# Patient Record
Sex: Female | Born: 1945
Health system: Southern US, Community
[De-identification: ages and names within clinical notes are randomized; demographics above are authoritative.]

## PROBLEM LIST (undated history)

## (undated) DIAGNOSIS — E785 Hyperlipidemia, unspecified: Secondary | ICD-10-CM

## (undated) DIAGNOSIS — R42 Dizziness and giddiness: Secondary | ICD-10-CM

## (undated) DIAGNOSIS — M810 Age-related osteoporosis without current pathological fracture: Secondary | ICD-10-CM

## (undated) DIAGNOSIS — R351 Nocturia: Secondary | ICD-10-CM

## (undated) DIAGNOSIS — I10 Essential (primary) hypertension: Secondary | ICD-10-CM

## (undated) DIAGNOSIS — M4800 Spinal stenosis, site unspecified: Secondary | ICD-10-CM

## (undated) DIAGNOSIS — M1990 Unspecified osteoarthritis, unspecified site: Secondary | ICD-10-CM

## (undated) DIAGNOSIS — K219 Gastro-esophageal reflux disease without esophagitis: Secondary | ICD-10-CM

## (undated) DIAGNOSIS — R200 Anesthesia of skin: Secondary | ICD-10-CM

## (undated) DIAGNOSIS — G479 Sleep disorder, unspecified: Secondary | ICD-10-CM

## (undated) HISTORY — DX: Dizziness and giddiness: R42

## (undated) HISTORY — PX: ABDOMINAL HYSTERECTOMY: SHX81

---

## 2010-01-25 ENCOUNTER — Encounter: Admission: RE | Admit: 2010-01-25 | Discharge: 2010-01-25 | Payer: Self-pay | Admitting: Obstetrics and Gynecology

## 2010-03-27 ENCOUNTER — Ambulatory Visit: Payer: Self-pay | Admitting: Vascular Surgery

## 2010-12-17 NOTE — Procedures (Signed)
DUPLEX DEEP VENOUS EXAM - LOWER EXTREMITY   INDICATION:  Right lower extremity pain for 1 year.   HISTORY:  Edema:  No.  Trauma/Surgery:  No.  Pain:  Yes.  PE:  No.  Previous DVT:  No.  Anticoagulants:  No.  Other:   DUPLEX EXAM:                CFV   SFV   PopV  PTV    GSV                R  L  R  L  R  L  R   L  R  L  Thrombosis    o  o  o     o     o      o  o  Spontaneous   +  +  +     +            +  +  Phasic        +  +  +     +            +  +  Augmentation  +  +  +     +            +  +  Compressible  +  +  +     +     +      +  +  Competent     o  +  o     o            +  +   Legend:  + - yes  o - no  p - partial  D - decreased   IMPRESSION:  1. No evidence of right lower extremity deep or superficial      thrombosis.  2. Evidence of mild deep venous insufficiency.    _____________________________  Di Kindle. Edilia Bo, M.D.   EM/MEDQ  D:  03/27/2010  T:  03/27/2010  Job:  [id number]

## 2010-12-17 NOTE — Consult Note (Signed)
VASCULAR SURGERY CONSULTATION   Boccio, Edris  DOB:  1945-12-15                                       03/27/2010  CHART#:[id number]   I saw the patient in the office today in consultation concerning her  right calf pain.  This is a pleasant 65 year old woman who noted the  gradual onset of pain in her right calf approximately a year ago.  She  works where she has to stand all day and states that standing does  aggravate her symptoms.  She also states that her right calf hurts with  ambulation and this is relieved somewhat with rest but really the pain  never goes away completely.  She has had no history of rest pain and no  history of nonhealing ulcers.  She did have a knee surgery and had a  pulmonary embolus after this.  This was about 2 years ago.  She was on  Coumadin for a year.  She is unaware of any other history of DVT.  The  only thing that relieves her symptoms somewhat is elevating her legs.   Her past medical history is significant for hypertension and  hypercholesterolemia, both of which are stable on her current  medications.  She denies any history of diabetes, history of previous  myocardial infarction, history of congestive heart failure or history of  COPD.   SOCIAL HISTORY:  She is single.  She has one child.  She works as a  Geographical information systems officer.  She does not smoke cigarettes.   FAMILY HISTORY:  There is no history of premature cardiovascular  disease.   REVIEW OF SYSTEMS:  GENERAL:  She has had no recent weight loss, weight  gain or problems with her appetite.  CARDIOVASCULAR:  She has had no chest pain, chest pressure, palpitations  or arrhythmias.  She has had no history of stroke, TIAs or amaurosis  fugax.  MUSCULOSKELETAL:  She does have muscle pain.  She has had no significant  arthritis.  GI, neurologic, pulmonary, hematologic, GU, ENT, psychiatric and  integumentary review of systems is unremarkable and is documented on the  medical  history form in her chart.   PHYSICAL EXAMINATION:  General:  This is a pleasant 65 year old woman  who appears her stated age.  Vital signs:  Blood pressure is 130/80,  heart rate is 64, saturation 98%, temperature 98.1.  HEENT:  Unremarkable.  Lungs:  Are clear bilaterally to auscultation without  rales, rhonchi or wheezing.  Cardiovascular:  I do not detect any  carotid bruits.  She has a regular rate and rhythm.  She has palpable  femoral, popliteal and dorsalis pedis pulses bilaterally.  I cannot  palpate posterior tibial pulses.  Both feet are warm and well-perfused.  She has no significant lower extremity swelling.  Abdomen:  Soft and  nontender with normal pitched bowel sounds.  No aneurysm is appreciated.  Musculoskeletal:  There are no major deformities or cyanosis.  Neurological:  She has no focal weakness or paresthesias.  Skin:  There  are no ulcers or rashes.   I did independently perform a Doppler study today which shows biphasic  dorsalis pedis and posterior tibial signals bilaterally.   I have also ordered and independently interpreted a venous duplex scan  and there is no evidence of DVT in the right lower extremity.  There  is  some evidence of mild deep venous insufficiency in the right common  femoral vein.  The saphenous vein is noted to be competent on the right  and also on the left.   IMPRESSION:  This patient has a long history of calf pain which I  suspect is related to the fact that she has to stand all day and she  does have some mild chronic venous insufficiency.  There is no evidence  of arterial insufficiency.  We have discussed the importance of  elevating her legs when she gets home from work and trying to walk or  stand on her toes at times to use her calf muscle pump to lower her  venous pressure.  I have also written her a prescription for knee-high  compression stockings with a gradient of 15-20 mmHg which may help her  symptoms at work.  I will  be happy to see her back at any time if any  new vascular issues arise.     Di Kindle. Edilia Bo, M.D.  Electronically Signed  CSD/MEDQ  D:  03/27/2010  T:  03/28/2010  Job:  [id number]   cc:   Dr Tommas Olp

## 2011-08-05 HISTORY — PX: ROTATOR CUFF REPAIR: SHX139

## 2012-09-27 ENCOUNTER — Other Ambulatory Visit: Payer: Self-pay | Admitting: Internal Medicine

## 2012-09-27 DIAGNOSIS — Z1231 Encounter for screening mammogram for malignant neoplasm of breast: Secondary | ICD-10-CM

## 2012-10-28 ENCOUNTER — Ambulatory Visit
Admission: RE | Admit: 2012-10-28 | Discharge: 2012-10-28 | Disposition: A | Discharge status: Home / Self Care | Payer: Medicare Other | Source: Ambulatory Visit | Attending: Internal Medicine | Admitting: Internal Medicine

## 2012-10-28 DIAGNOSIS — Z1231 Encounter for screening mammogram for malignant neoplasm of breast: Secondary | ICD-10-CM

## 2012-10-29 ENCOUNTER — Other Ambulatory Visit: Payer: Self-pay | Admitting: Internal Medicine

## 2012-10-29 DIAGNOSIS — R928 Other abnormal and inconclusive findings on diagnostic imaging of breast: Secondary | ICD-10-CM

## 2012-11-09 ENCOUNTER — Other Ambulatory Visit: Payer: Medicare Other

## 2012-11-18 ENCOUNTER — Ambulatory Visit
Admission: RE | Admit: 2012-11-18 | Discharge: 2012-11-18 | Disposition: A | Discharge status: Home / Self Care | Payer: Medicare Other | Source: Ambulatory Visit | Attending: Internal Medicine | Admitting: Internal Medicine

## 2012-11-18 DIAGNOSIS — R928 Other abnormal and inconclusive findings on diagnostic imaging of breast: Secondary | ICD-10-CM

## 2012-12-08 ENCOUNTER — Other Ambulatory Visit: Payer: Self-pay | Admitting: Orthopedic Surgery

## 2012-12-08 DIAGNOSIS — N289 Disorder of kidney and ureter, unspecified: Secondary | ICD-10-CM

## 2012-12-14 ENCOUNTER — Ambulatory Visit
Admission: RE | Admit: 2012-12-14 | Discharge: 2012-12-14 | Disposition: A | Discharge status: Home / Self Care | Payer: Medicare Other | Source: Ambulatory Visit | Attending: Orthopedic Surgery | Admitting: Orthopedic Surgery

## 2012-12-14 DIAGNOSIS — N289 Disorder of kidney and ureter, unspecified: Secondary | ICD-10-CM

## 2013-01-17 ENCOUNTER — Other Ambulatory Visit: Payer: Self-pay | Admitting: Orthopedic Surgery

## 2013-01-17 DIAGNOSIS — M48061 Spinal stenosis, lumbar region without neurogenic claudication: Secondary | ICD-10-CM

## 2013-01-20 ENCOUNTER — Inpatient Hospital Stay
Admission: RE | Admit: 2013-01-20 | Discharge: 2013-01-20 | Disposition: A | Discharge status: Home / Self Care | Payer: Self-pay | Source: Ambulatory Visit | Attending: Orthopedic Surgery | Admitting: Orthopedic Surgery

## 2013-01-20 ENCOUNTER — Other Ambulatory Visit: Payer: Self-pay | Admitting: Orthopedic Surgery

## 2013-01-20 ENCOUNTER — Ambulatory Visit
Admission: RE | Admit: 2013-01-20 | Discharge: 2013-01-20 | Disposition: A | Discharge status: Home / Self Care | Payer: Medicare Other | Source: Ambulatory Visit | Attending: Orthopedic Surgery | Admitting: Orthopedic Surgery

## 2013-01-20 VITALS — BP 106/57 | HR 74 | Ht 62.0 in | Wt 148.0 lb

## 2013-01-20 DIAGNOSIS — M48061 Spinal stenosis, lumbar region without neurogenic claudication: Secondary | ICD-10-CM

## 2013-01-20 DIAGNOSIS — M549 Dorsalgia, unspecified: Secondary | ICD-10-CM

## 2013-01-20 MED ORDER — DIAZEPAM 5 MG PO TABS
5.0000 mg | ORAL_TABLET | Freq: Once | ORAL | Status: AC
  Administered 2013-01-20: 5 mg via ORAL

## 2013-01-20 MED ORDER — IOHEXOL 180 MG/ML  SOLN
15.0000 mL | Freq: Once | INTRAMUSCULAR | Status: AC | PRN
  Administered 2013-01-20: 15 mL via INTRATHECAL

## 2013-01-20 MED ORDER — ONDANSETRON HCL 4 MG/2ML IJ SOLN
4.0000 mg | Freq: Once | INTRAMUSCULAR | Status: AC
  Administered 2013-01-20: 4 mg via INTRAMUSCULAR

## 2013-01-20 MED ORDER — MEPERIDINE HCL 100 MG/ML IJ SOLN
75.0000 mg | Freq: Once | INTRAMUSCULAR | Status: AC
  Administered 2013-01-20: 75 mg via INTRAMUSCULAR

## 2013-01-20 NOTE — Progress Notes (Signed)
Discharge instructions explained to pt. 

## 2013-02-15 ENCOUNTER — Other Ambulatory Visit (HOSPITAL_COMMUNITY): Payer: Self-pay | Admitting: *Deleted

## 2013-02-16 ENCOUNTER — Encounter (HOSPITAL_COMMUNITY): Payer: Self-pay

## 2013-02-16 ENCOUNTER — Ambulatory Visit (HOSPITAL_COMMUNITY)
Admission: RE | Admit: 2013-02-16 | Discharge: 2013-02-16 | Disposition: A | Discharge status: Home / Self Care | Payer: Medicare Other | Source: Ambulatory Visit | Attending: Surgical | Admitting: Surgical

## 2013-02-16 ENCOUNTER — Encounter (HOSPITAL_COMMUNITY)
Admission: RE | Admit: 2013-02-16 | Discharge: 2013-02-16 | Disposition: A | Discharge status: Home / Self Care | Payer: Medicare Other | Source: Ambulatory Visit | Attending: Orthopedic Surgery | Admitting: Orthopedic Surgery

## 2013-02-16 ENCOUNTER — Encounter (HOSPITAL_COMMUNITY): Payer: Self-pay | Admitting: Pharmacy Technician

## 2013-02-16 DIAGNOSIS — I7090 Unspecified atherosclerosis: Secondary | ICD-10-CM | POA: Insufficient documentation

## 2013-02-16 DIAGNOSIS — Z01810 Encounter for preprocedural cardiovascular examination: Secondary | ICD-10-CM | POA: Insufficient documentation

## 2013-02-16 DIAGNOSIS — M4800 Spinal stenosis, site unspecified: Secondary | ICD-10-CM | POA: Insufficient documentation

## 2013-02-16 DIAGNOSIS — M4310 Spondylolisthesis, site unspecified: Secondary | ICD-10-CM | POA: Insufficient documentation

## 2013-02-16 DIAGNOSIS — Z01818 Encounter for other preprocedural examination: Secondary | ICD-10-CM | POA: Insufficient documentation

## 2013-02-16 HISTORY — DX: Gastro-esophageal reflux disease without esophagitis: K21.9

## 2013-02-16 HISTORY — DX: Unspecified osteoarthritis, unspecified site: M19.90

## 2013-02-16 HISTORY — DX: Age-related osteoporosis without current pathological fracture: M81.0

## 2013-02-16 HISTORY — DX: Anesthesia of skin: R20.0

## 2013-02-16 HISTORY — DX: Spinal stenosis, site unspecified: M48.00

## 2013-02-16 HISTORY — DX: Nocturia: R35.1

## 2013-02-16 HISTORY — DX: Sleep disorder, unspecified: G47.9

## 2013-02-16 HISTORY — DX: Hyperlipidemia, unspecified: E78.5

## 2013-02-16 HISTORY — DX: Essential (primary) hypertension: I10

## 2013-02-16 LAB — COMPREHENSIVE METABOLIC PANEL
ALT: 21 U/L (ref 0–35)
AST: 25 U/L (ref 0–37)
Albumin: 4.4 g/dL (ref 3.5–5.2)
Alkaline Phosphatase: 145 U/L — ABNORMAL HIGH (ref 39–117)
BUN: 12 mg/dL (ref 6–23)
CO2: 24 mEq/L (ref 19–32)
Calcium: 10 mg/dL (ref 8.4–10.5)
Chloride: 105 mEq/L (ref 96–112)
Creatinine, Ser: 0.67 mg/dL (ref 0.50–1.10)
GFR calc Af Amer: >90 mL/min (ref >90)
GFR calc non Af Amer: 88 mL/min — ABNORMAL LOW (ref >90)
Glucose, Bld: 120 mg/dL — ABNORMAL HIGH (ref 70–99)
Potassium: 4.3 mEq/L (ref 3.5–5.1)
Sodium: 140 mEq/L (ref 135–145)
Total Bilirubin: 0.4 mg/dL (ref 0.3–1.2)
Total Protein: 7.8 g/dL (ref 6.0–8.3)

## 2013-02-16 LAB — CBC
HCT: 41.7 % (ref 36.0–46.0)
MCHC: 33.3 g/dL (ref 30.0–36.0)
Platelets: 369 10*3/uL (ref 150–400)
RDW: 13.9 % (ref 11.5–15.5)
WBC: 7.9 10*3/uL (ref 4.0–10.5)

## 2013-02-16 LAB — URINALYSIS, ROUTINE W REFLEX MICROSCOPIC
Bilirubin Urine: NEGATIVE
Glucose, UA: NEGATIVE mg/dL
Hgb urine dipstick: NEGATIVE
Ketones, ur: NEGATIVE mg/dL
Nitrite: NEGATIVE
Protein, ur: NEGATIVE mg/dL
Specific Gravity, Urine: 1.03 (ref 1.005–1.030)
Urobilinogen, UA: 1 mg/dL (ref 0.0–1.0)
pH: 7 (ref 5.0–8.0)

## 2013-02-16 LAB — URINE MICROSCOPIC-ADD ON

## 2013-02-16 LAB — PROTIME-INR
INR: 0.95 (ref 0.00–1.49)
Prothrombin Time: 12.5 seconds (ref 11.6–15.2)

## 2013-02-16 LAB — APTT: aPTT: 29 seconds (ref 24–37)

## 2013-02-16 LAB — SURGICAL PCR SCREEN
MRSA, PCR: NEGATIVE
Staphylococcus aureus: NEGATIVE

## 2013-02-16 NOTE — Patient Instructions (Addendum)
Kristy Pope  02/16/2013                           YOUR PROCEDURE IS SCHEDULED ON:  02/22/13               PLEASE REPORT TO SHORT STAY CENTER AT :  10:30 AM               CALL THIS NUMBER IF ANY PROBLEMS THE DAY OF SURGERY :               [phone]                      REMEMBER:   Do not eat food or drink liquids AFTER MIDNIGHT  May have clear liquids UNTIL 6 HOURS BEFORE SURGERY (7:00 AM)  Clear liquids include soda, tea, black coffee, apple or grape juice, broth.  Take these medicines the morning of surgery with A SIP OF WATER: NONE (TAKE MEDS THE EVENING BEFORE LIKE YOU NORMALLY DO )   Do not wear jewelry, make-up   Do not wear lotions, powders, or perfumes.   Do not shave legs or underarms 12 hrs. before surgery (men may shave face)  Do not bring valuables to the hospital.  Contacts, dentures or bridgework may not be worn into surgery.  Leave suitcase in the car. After surgery it may be brought to your room.  For patients admitted to the hospital more than one night, checkout time is 11:00                          The day of discharge.   Patients discharged the day of surgery will not be allowed to drive home                             If going home same day of surgery, must have someone stay with you first                           24 hrs at home and arrange for some one to drive you home from hospital.    Special Instructions:   Please read over the following fact sheets that you were given:               1. MRSA  INFORMATION                      2. [place] PREPARING FOR SURGERY SHEET               3. INCENTIVE SPIROMETER                                                X_____________________________________________________________________        Failure to follow these instructions may result in cancellation of your surgery

## 2013-02-16 NOTE — Progress Notes (Signed)
Abnormal UA / BMET faxed to Dr. Gioffre 

## 2013-02-20 NOTE — H&P (Signed)
Kristy Pope is an 67 y.o. female.   Chief Complaint: back pain HPI: Kristy Pope presented with the chief complaint of low back pain with no known injury. She has been dealing with this for 2-3 years. She denies numbness and tingling in the legs but has pain radiating into the both legs, left greater than right. No change in bladder or bowel function. MRI showed the she has some spinal stenosis at 4-5 with recess stenosis. She also has facet arthrosis at L5-S1.CT myelogram revealed that she has a complete block at L4-5.   Past Medical History  Diagnosis Date  . Hypertension   . Hyperlipidemia   . Numbness     RT LEG  . Spinal stenosis   . Arthritis   . Osteoporosis   . GERD (gastroesophageal reflux disease)   . Nocturia   . Sleeping difficulty     TAKES AMBIEN PRN    Past Surgical History  Procedure Laterality Date  . Rotator cuff repair  2013    LEFT  . Abdominal hysterectomy      Social History:  reports that she has never smoked. She has never used smokeless tobacco. She reports that she does not drink alcohol or use illicit drugs.  Allergies: No Known Allergies  Current outpatient prescriptions: acetaminophen (TYLENOL) 500 MG tablet, Take 1,000 mg by mouth every 6 (six) hours as needed for pain., Disp: , Rfl: ;  alendronate (FOSAMAX) 70 MG tablet, Take 70 mg by mouth every 7 (seven) days. Take with a full glass of water on an empty stomach., Disp: , Rfl: ;  amLODipine (NORVASC) 10 MG tablet, Take 10 mg by mouth every evening., Disp: , Rfl: ;  aspirin 81 MG tablet, Take 81 mg by mouth daily., Disp: , Rfl:  atorvastatin (LIPITOR) 80 MG tablet, Take 80 mg by mouth every evening., Disp: , Rfl: ;  ezetimibe (ZETIA) 10 MG tablet, Take 10 mg by mouth every evening., Disp: , Rfl: ;  HYDROcodone-acetaminophen (NORCO/VICODIN) 5-325 MG per tablet, Take 1 tablet by mouth every 8 (eight) hours as needed for pain., Disp: , Rfl: ;  omeprazole (PRILOSEC) 20 MG capsule, Take 20 mg by mouth daily., Disp: ,  Rfl:  zolpidem (AMBIEN) 10 MG tablet, Take 10 mg by mouth at bedtime as needed for sleep., Disp: , Rfl:    Review of Systems  Constitutional: Negative.   HENT: Negative.   Eyes: Negative.   Respiratory: Negative.   Cardiovascular: Positive for chest pain. Negative for palpitations, orthopnea, claudication, leg swelling and PND.       Cheat tenderness- muscular  Gastrointestinal: Negative.   Genitourinary: Negative.   Musculoskeletal: Positive for back pain and joint pain. Negative for myalgias and falls.  Skin: Negative.   Neurological: Negative.   Endo/Heme/Allergies: Negative.   Psychiatric/Behavioral: Negative.    Vitals Weight: 146 lb Height: 62 in Body Surface Area: 1.7 m Body Mass Index: 26.7 kg/m Pulse: 93 (Regular) BP: 178/87 (Sitting, Left Arm, Standard)  Physical Exam  Constitutional: She is oriented to person, place, and time. She appears well-developed and well-nourished. No distress.  HENT:  Head: Normocephalic and atraumatic.  Right Ear: External ear normal.  Left Ear: External ear normal.  Nose: Nose normal.  Mouth/Throat: Oropharynx is clear and moist.  Eyes: Conjunctivae and EOM are normal.  Neck: Normal range of motion. Neck supple. No tracheal deviation present. No thyromegaly present.  Cardiovascular: Normal rate, regular rhythm, normal heart sounds and intact distal pulses.   No murmur heard. Respiratory:  Effort normal and breath sounds normal. No respiratory distress. She has no wheezes. She exhibits no tenderness.  GI: Soft. Bowel sounds are normal. She exhibits no distension and no mass. There is no tenderness.  Musculoskeletal:       Right hip: Normal.       Left hip: Normal.       Right knee: Normal.       Left knee: Normal.       Lumbar back: She exhibits decreased range of motion, tenderness, pain and spasm.       Right lower leg: She exhibits no tenderness and no swelling.       Left lower leg: She exhibits no tenderness and no  swelling.  Lymphadenopathy:    She has no cervical adenopathy.  Neurological: She is alert and oriented to person, place, and time. She has normal strength and normal reflexes. No sensory deficit.  Skin: No rash noted. She is not diaphoretic. No erythema.  Psychiatric: She has a normal mood and affect. Her behavior is normal.     Assessment/Plan Spinal Stenosis, Lumbar (724.02) She needs to have decompressive lumbar laminectomy for spinals stenosis at L4-5. The possible complications of spinal surgery number one could be infection, which is extremely rare. We do use antibiotics prior to the surgery and during surgery and after surgery. Number two is always a slight degree of probability that you could develop a blood clot in your leg after any type of surgery and we try our best to prevent that with aspirin post op when it is safe to begin. The third is a dural leak. That is the spinal fluid leak that could occur. At certain rare times the bone or the disc could literally stick to the dura which is the lining which contains the spinal fluid and we could develop a small tear in that lining which we then patch up. That is an extremely rare complication. The last and final complication is a recurrent disc rupture. That means that you could rupture another small piece of disc later on down the road and there is about a 2% chance of that.    Kristy Pope LAUREN 02/20/2013, 1:00 PM

## 2013-02-22 ENCOUNTER — Inpatient Hospital Stay (HOSPITAL_COMMUNITY): Payer: Medicare Other

## 2013-02-22 ENCOUNTER — Inpatient Hospital Stay (HOSPITAL_COMMUNITY)
Admission: RE | Admit: 2013-02-22 | Discharge: 2013-02-24 | DRG: 491 | Disposition: A | Discharge status: Home Health | Payer: Medicare Other | Source: Ambulatory Visit | Attending: Orthopedic Surgery | Admitting: Orthopedic Surgery

## 2013-02-22 ENCOUNTER — Inpatient Hospital Stay (HOSPITAL_COMMUNITY): Payer: Medicare Other | Admitting: Anesthesiology

## 2013-02-22 ENCOUNTER — Encounter (HOSPITAL_COMMUNITY)
Admission: RE | Disposition: A | Discharge status: Home Health | Payer: Self-pay | Source: Ambulatory Visit | Attending: Orthopedic Surgery

## 2013-02-22 ENCOUNTER — Encounter (HOSPITAL_COMMUNITY): Payer: Self-pay | Admitting: *Deleted

## 2013-02-22 ENCOUNTER — Encounter (HOSPITAL_COMMUNITY): Payer: Self-pay | Admitting: Anesthesiology

## 2013-02-22 DIAGNOSIS — M810 Age-related osteoporosis without current pathological fracture: Secondary | ICD-10-CM | POA: Diagnosis present

## 2013-02-22 DIAGNOSIS — I10 Essential (primary) hypertension: Secondary | ICD-10-CM | POA: Diagnosis present

## 2013-02-22 DIAGNOSIS — M48062 Spinal stenosis, lumbar region with neurogenic claudication: Principal | ICD-10-CM | POA: Diagnosis present

## 2013-02-22 DIAGNOSIS — R12 Heartburn: Secondary | ICD-10-CM | POA: Diagnosis not present

## 2013-02-22 DIAGNOSIS — M479 Spondylosis, unspecified: Secondary | ICD-10-CM | POA: Diagnosis present

## 2013-02-22 DIAGNOSIS — E785 Hyperlipidemia, unspecified: Secondary | ICD-10-CM | POA: Diagnosis present

## 2013-02-22 HISTORY — PX: LUMBAR LAMINECTOMY/DECOMPRESSION MICRODISCECTOMY: SHX5026

## 2013-02-22 SURGERY — LUMBAR LAMINECTOMY/DECOMPRESSION MICRODISCECTOMY 1 LEVEL
Anesthesia: General | Site: Back | Wound class: Clean

## 2013-02-22 MED ORDER — LACTATED RINGERS IV SOLN
INTRAVENOUS | Status: DC
  Administered 2013-02-22: 1000 mL via INTRAVENOUS
  Administered 2013-02-22: 14:00:00 via INTRAVENOUS

## 2013-02-22 MED ORDER — HYDROMORPHONE HCL PF 1 MG/ML IJ SOLN
INTRAMUSCULAR | Status: DC | PRN
  Administered 2013-02-22: 1 mg via INTRAVENOUS

## 2013-02-22 MED ORDER — EZETIMIBE 10 MG PO TABS
10.0000 mg | ORAL_TABLET | Freq: Every evening | ORAL | Status: DC
  Administered 2013-02-22 – 2013-02-23 (×2): 10 mg via ORAL
  Filled 2013-02-22 (×3): qty 1

## 2013-02-22 MED ORDER — AMLODIPINE BESYLATE 10 MG PO TABS
10.0000 mg | ORAL_TABLET | Freq: Every evening | ORAL | Status: DC
  Administered 2013-02-22 – 2013-02-23 (×2): 10 mg via ORAL
  Filled 2013-02-22 (×3): qty 1

## 2013-02-22 MED ORDER — BUPIVACAINE LIPOSOME 1.3 % IJ SUSP
INTRAMUSCULAR | Status: DC | PRN
  Administered 2013-02-22: 20 mL

## 2013-02-22 MED ORDER — HYDROCODONE-ACETAMINOPHEN 5-325 MG PO TABS
1.0000 | ORAL_TABLET | ORAL | Status: DC | PRN
  Administered 2013-02-22 (×2): 1 via ORAL
  Filled 2013-02-22 (×2): qty 1

## 2013-02-22 MED ORDER — CEFAZOLIN SODIUM 1-5 GM-% IV SOLN
1.0000 g | Freq: Three times a day (TID) | INTRAVENOUS | Status: AC
  Administered 2013-02-22 – 2013-02-23 (×3): 1 g via INTRAVENOUS
  Filled 2013-02-22 (×3): qty 50

## 2013-02-22 MED ORDER — BISACODYL 10 MG RE SUPP: 10.0000 mg | Freq: Every day | RECTAL | Status: DC | PRN

## 2013-02-22 MED ORDER — MEPERIDINE HCL 50 MG/ML IJ SOLN: 6.2500 mg | INTRAMUSCULAR | Status: DC | PRN

## 2013-02-22 MED ORDER — ALENDRONATE SODIUM 70 MG PO TABS
70.0000 mg | ORAL_TABLET | ORAL | Status: DC
  Filled 2013-02-22: qty 1

## 2013-02-22 MED ORDER — ROCURONIUM BROMIDE 100 MG/10ML IV SOLN
INTRAVENOUS | Status: DC | PRN
  Administered 2013-02-22: 10 mg via INTRAVENOUS
  Administered 2013-02-22: 5 mg via INTRAVENOUS
  Administered 2013-02-22: 40 mg via INTRAVENOUS
  Administered 2013-02-22: 10 mg via INTRAVENOUS

## 2013-02-22 MED ORDER — LIDOCAINE HCL (CARDIAC) 20 MG/ML IV SOLN
INTRAVENOUS | Status: DC | PRN
  Administered 2013-02-22: 50 mg via INTRAVENOUS

## 2013-02-22 MED ORDER — ACETAMINOPHEN 325 MG PO TABS: 650.0000 mg | ORAL_TABLET | ORAL | Status: DC | PRN

## 2013-02-22 MED ORDER — PROPOFOL 10 MG/ML IV BOLUS
INTRAVENOUS | Status: DC | PRN
  Administered 2013-02-22: 140 mg via INTRAVENOUS

## 2013-02-22 MED ORDER — PROMETHAZINE HCL 25 MG/ML IJ SOLN: 6.2500 mg | INTRAMUSCULAR | Status: DC | PRN

## 2013-02-22 MED ORDER — THROMBIN 5000 UNITS EX SOLR
CUTANEOUS | Status: DC | PRN
  Administered 2013-02-22: 10000 [IU] via TOPICAL

## 2013-02-22 MED ORDER — METHOCARBAMOL 500 MG PO TABS
500.0000 mg | ORAL_TABLET | Freq: Four times a day (QID) | ORAL | Status: DC | PRN
  Administered 2013-02-22 – 2013-02-24 (×5): 500 mg via ORAL
  Filled 2013-02-22 (×5): qty 1

## 2013-02-22 MED ORDER — SODIUM CHLORIDE 0.9 % IR SOLN
Status: DC | PRN
  Administered 2013-02-22: 13:00:00

## 2013-02-22 MED ORDER — OXYCODONE-ACETAMINOPHEN 5-325 MG PO TABS
1.0000 | ORAL_TABLET | ORAL | Status: DC | PRN
  Administered 2013-02-22 – 2013-02-24 (×9): 2 via ORAL
  Filled 2013-02-22 (×9): qty 2

## 2013-02-22 MED ORDER — HYDROMORPHONE HCL PF 1 MG/ML IJ SOLN
0.5000 mg | INTRAMUSCULAR | Status: DC | PRN
  Administered 2013-02-22: 0.5 mg via INTRAVENOUS
  Filled 2013-02-22: qty 1

## 2013-02-22 MED ORDER — LACTATED RINGERS IV SOLN
INTRAVENOUS | Status: DC
  Administered 2013-02-22 – 2013-02-23 (×2): via INTRAVENOUS

## 2013-02-22 MED ORDER — ATORVASTATIN CALCIUM 80 MG PO TABS
80.0000 mg | ORAL_TABLET | Freq: Every evening | ORAL | Status: DC
  Administered 2013-02-22 – 2013-02-23 (×2): 80 mg via ORAL
  Filled 2013-02-22 (×3): qty 1

## 2013-02-22 MED ORDER — POLYETHYLENE GLYCOL 3350 17 G PO PACK: 17.0000 g | PACK | Freq: Every day | ORAL | Status: DC | PRN

## 2013-02-22 MED ORDER — METHOCARBAMOL 100 MG/ML IJ SOLN
500.0000 mg | Freq: Four times a day (QID) | INTRAVENOUS | Status: DC | PRN
  Administered 2013-02-22: 500 mg via INTRAVENOUS
  Filled 2013-02-22 (×2): qty 5

## 2013-02-22 MED ORDER — FENTANYL CITRATE 0.05 MG/ML IJ SOLN
INTRAMUSCULAR | Status: DC | PRN
  Administered 2013-02-22 (×2): 100 ug via INTRAVENOUS
  Administered 2013-02-22: 50 ug via INTRAVENOUS

## 2013-02-22 MED ORDER — NEOSTIGMINE METHYLSULFATE 1 MG/ML IJ SOLN
INTRAMUSCULAR | Status: DC | PRN
  Administered 2013-02-22: 4 mg via INTRAVENOUS

## 2013-02-22 MED ORDER — ACETAMINOPHEN 10 MG/ML IV SOLN
1000.0000 mg | Freq: Once | INTRAVENOUS | Status: AC | PRN
  Administered 2013-02-22: 1000 mg via INTRAVENOUS
  Filled 2013-02-22: qty 100

## 2013-02-22 MED ORDER — ONDANSETRON HCL 4 MG/2ML IJ SOLN
INTRAMUSCULAR | Status: DC | PRN
  Administered 2013-02-22: 4 mg via INTRAVENOUS

## 2013-02-22 MED ORDER — PHENOL 1.4 % MT LIQD
1.0000 | OROMUCOSAL | Status: DC | PRN
  Administered 2013-02-22: 1 via OROMUCOSAL
  Filled 2013-02-22: qty 177

## 2013-02-22 MED ORDER — PHENYLEPHRINE HCL 10 MG/ML IJ SOLN
INTRAMUSCULAR | Status: DC | PRN
  Administered 2013-02-22 (×2): 40 ug via INTRAVENOUS

## 2013-02-22 MED ORDER — BUPIVACAINE LIPOSOME 1.3 % IJ SUSP
20.0000 mL | Freq: Once | INTRAMUSCULAR | Status: DC
  Filled 2013-02-22: qty 20

## 2013-02-22 MED ORDER — ONDANSETRON HCL 4 MG/2ML IJ SOLN: 4.0000 mg | INTRAMUSCULAR | Status: DC | PRN

## 2013-02-22 MED ORDER — ZOLPIDEM TARTRATE 10 MG PO TABS: 10.0000 mg | ORAL_TABLET | Freq: Every evening | ORAL | Status: DC | PRN

## 2013-02-22 MED ORDER — FLEET ENEMA 7-19 GM/118ML RE ENEM: 1.0000 | ENEMA | Freq: Once | RECTAL | Status: AC | PRN

## 2013-02-22 MED ORDER — OXYCODONE HCL 5 MG/5ML PO SOLN
5.0000 mg | Freq: Once | ORAL | Status: DC | PRN
  Filled 2013-02-22: qty 5

## 2013-02-22 MED ORDER — BACITRACIN-NEOMYCIN-POLYMYXIN 400-5-5000 EX OINT
TOPICAL_OINTMENT | CUTANEOUS | Status: DC | PRN
  Administered 2013-02-22: 1 via TOPICAL

## 2013-02-22 MED ORDER — HYDROMORPHONE HCL PF 1 MG/ML IJ SOLN
0.2500 mg | INTRAMUSCULAR | Status: DC | PRN
  Administered 2013-02-22 (×3): 0.5 mg via INTRAVENOUS

## 2013-02-22 MED ORDER — ACETAMINOPHEN 650 MG RE SUPP: 650.0000 mg | RECTAL | Status: DC | PRN

## 2013-02-22 MED ORDER — PANTOPRAZOLE SODIUM 40 MG PO TBEC
40.0000 mg | DELAYED_RELEASE_TABLET | Freq: Every day | ORAL | Status: DC
  Administered 2013-02-22 – 2013-02-24 (×3): 40 mg via ORAL
  Filled 2013-02-22 (×3): qty 1

## 2013-02-22 MED ORDER — OXYCODONE HCL 5 MG PO TABS: 5.0000 mg | ORAL_TABLET | Freq: Once | ORAL | Status: DC | PRN

## 2013-02-22 MED ORDER — GLYCOPYRROLATE 0.2 MG/ML IJ SOLN
INTRAMUSCULAR | Status: DC | PRN
  Administered 2013-02-22: .8 mg via INTRAVENOUS

## 2013-02-22 MED ORDER — MENTHOL 3 MG MT LOZG: 1.0000 | LOZENGE | OROMUCOSAL | Status: DC | PRN

## 2013-02-22 MED ORDER — CEFAZOLIN SODIUM-DEXTROSE 2-3 GM-% IV SOLR
2.0000 g | INTRAVENOUS | Status: AC
  Administered 2013-02-22: 2 g via INTRAVENOUS

## 2013-02-22 SURGICAL SUPPLY — 42 items
BAG ZIPLOCK 12X15 (MISCELLANEOUS) ×2 IMPLANT
BENZOIN TINCTURE PRP APPL 2/3 (GAUZE/BANDAGES/DRESSINGS) ×2 IMPLANT
CLEANER TIP ELECTROSURG 2X2 (MISCELLANEOUS) ×2 IMPLANT
CLOTH BEACON ORANGE TIMEOUT ST (SAFETY) ×2 IMPLANT
CONT SPECI 4OZ STER CLIK (MISCELLANEOUS) ×2 IMPLANT
DRAIN PENROSE 18X1/4 LTX STRL (WOUND CARE) IMPLANT
DRAPE MICROSCOPE LEICA (MISCELLANEOUS) ×2 IMPLANT
DRAPE POUCH INSTRU U-SHP 10X18 (DRAPES) ×2 IMPLANT
DRAPE SURG 17X11 SM STRL (DRAPES) ×2 IMPLANT
DRSG ADAPTIC 3X8 NADH LF (GAUZE/BANDAGES/DRESSINGS) ×2 IMPLANT
DRSG PAD ABDOMINAL 8X10 ST (GAUZE/BANDAGES/DRESSINGS) ×2 IMPLANT
DURAPREP 26ML APPLICATOR (WOUND CARE) ×2 IMPLANT
ELECT REM PT RETURN 9FT ADLT (ELECTROSURGICAL) ×2
ELECTRODE REM PT RTRN 9FT ADLT (ELECTROSURGICAL) ×1 IMPLANT
GLOVE BIOGEL PI IND STRL 8 (GLOVE) ×2 IMPLANT
GLOVE BIOGEL PI INDICATOR 8 (GLOVE) ×2
GLOVE ECLIPSE 8.0 STRL XLNG CF (GLOVE) ×4 IMPLANT
GOWN PREVENTION PLUS LG XLONG (DISPOSABLE) ×4 IMPLANT
GOWN STRL REIN XL XLG (GOWN DISPOSABLE) ×4 IMPLANT
KIT BASIN OR (CUSTOM PROCEDURE TRAY) ×2 IMPLANT
KIT POSITIONING SURG ANDREWS (MISCELLANEOUS) ×2 IMPLANT
MANIFOLD NEPTUNE II (INSTRUMENTS) ×2 IMPLANT
NEEDLE SPNL 18GX3.5 QUINCKE PK (NEEDLE) ×4 IMPLANT
NS IRRIG 1000ML POUR BTL (IV SOLUTION) ×2 IMPLANT
PATTIES SURGICAL .5 X.5 (GAUZE/BANDAGES/DRESSINGS) ×2 IMPLANT
PATTIES SURGICAL .75X.75 (GAUZE/BANDAGES/DRESSINGS) IMPLANT
PATTIES SURGICAL 1X1 (DISPOSABLE) IMPLANT
PIN SAFETY NICK PLATE  2 MED (MISCELLANEOUS)
PIN SAFETY NICK PLATE 2 MED (MISCELLANEOUS) IMPLANT
POSITIONER SURGICAL ARM (MISCELLANEOUS) ×2 IMPLANT
SPONGE GAUZE 4X4 12PLY (GAUZE/BANDAGES/DRESSINGS) ×2 IMPLANT
SPONGE LAP 4X18 X RAY DECT (DISPOSABLE) ×4 IMPLANT
SPONGE SURGIFOAM ABS GEL 100 (HEMOSTASIS) ×2 IMPLANT
STAPLER VISISTAT 35W (STAPLE) IMPLANT
SUT VIC AB 0 CT1 27 (SUTURE) ×1
SUT VIC AB 0 CT1 27XBRD ANTBC (SUTURE) ×1 IMPLANT
SUT VIC AB 1 CT1 27 (SUTURE) ×4
SUT VIC AB 1 CT1 27XBRD ANTBC (SUTURE) ×4 IMPLANT
TAPE CLOTH SURG 4X10 WHT LF (GAUZE/BANDAGES/DRESSINGS) ×2 IMPLANT
TOWEL OR 17X26 10 PK STRL BLUE (TOWEL DISPOSABLE) ×4 IMPLANT
TRAY LAMINECTOMY (CUSTOM PROCEDURE TRAY) ×2 IMPLANT
WATER STERILE IRR 1500ML POUR (IV SOLUTION) IMPLANT

## 2013-02-22 NOTE — Interval H&P Note (Signed)
History and Physical Interval Note:  02/22/2013 1:08 PM  Kristy Pope  has presented today for surgery, with the diagnosis of Spinal Stenosis  The various methods of treatment have been discussed with the patient and family. After consideration of risks, benefits and other options for treatment, the patient has consented to  Procedure(s): LUMBAR LAMINECTOMY/COMPLETE DECOMPRESSION L4 - L5 1 LEVEL (N/A) as a surgical intervention .  The patient's history has been reviewed, patient examined, no change in status, stable for surgery.  I have reviewed the patient's chart and labs.  Questions were answered to the patient's satisfaction.     Tosh Glaze A

## 2013-02-22 NOTE — Anesthesia Postprocedure Evaluation (Signed)
  Anesthesia Post-op Note  Patient: Kristy Pope  Procedure(s) Performed: Procedure(s) (LRB): LUMBAR LAMINECTOMY/COMPLETE DECOMPRESSION L4 - L5 1 LEVEL (N/A)  Patient Location: PACU  Anesthesia Type: General  Level of Consciousness: awake and alert   Airway and Oxygen Therapy: Patient Spontanous Breathing  Post-op Pain: mild  Post-op Assessment: Post-op Vital signs reviewed, Patient's Cardiovascular Status Stable, Respiratory Function Stable, Patent Airway and No signs of Nausea or vomiting  Last Vitals:  Filed Vitals:   02/22/13 1543  BP:   Pulse: 82  Temp: 36.8 C  Resp: 12    Post-op Vital Signs: stable   Complications: No apparent anesthesia complications

## 2013-02-22 NOTE — H&P (View-Only) (Signed)
Abnormal UA / BMET faxed to Dr. Darrelyn Hillock

## 2013-02-22 NOTE — Brief Op Note (Signed)
02/22/2013  3:38 PM  PATIENT:  Kristy Pope  67 y.o. female  PRE-OPERATIVE DIAGNOSIS:  Spinal Stenosis,Severe   POST-OPERATIVE DIAGNOSIS:  SPINAL STENOSIS,Severe at L-3-L-4,L-4-L-5  PROCEDURE:  Procedure(s): LUMBAR LAMINECTOMY/COMPLETE DECOMPRESSION L4 - L5 1 LEVEL (N/A) and L-3-l-4.\,with Bilateral Foraminotomies at TWO  Levels for L-4 and L-5 Roots.  SURGEON:  Surgeon(s) and Role:    * Jacki Cones, MD - Primary    * Drucilla Schmidt, MD - Assisting  :   ASSISTANTS:James Aplington MD   ANESTHESIA:   general  EBL:  Total I/O In: 1000 [I.V.:1000] Out: 250 [Urine:250]  BLOOD ADMINISTERED:none  DRAINS: none   LOCAL MEDICATIONS USED:  BUPIVICAINE 20cc.  SPECIMEN:  No Specimen  DISPOSITION OF SPECIMEN:  N/A  COUNTS:  YES  TOURNIQUET:  * No tourniquets in log *  DICTATION: .Other Dictation: Dictation Number [id number]  PLAN OF CARE: Admit for overnight observation  PATIENT DISPOSITION:  PACU - hemodynamically stable.   Delay start of Pharmacological VTE agent (>24hrs) due to surgical blood loss or risk of bleeding: yes

## 2013-02-22 NOTE — Transfer of Care (Signed)
Immediate Anesthesia Transfer of Care Note  Patient: Kristy Pope  Procedure(s) Performed: Procedure(s) (LRB): LUMBAR LAMINECTOMY/COMPLETE DECOMPRESSION L4 - L5 1 LEVEL (N/A)  Patient Location: PACU  Anesthesia Type: General  Level of Consciousness: sedated, patient cooperative and responds to stimulaton  Airway & Oxygen Therapy: Patient Spontanous Breathing and Patient connected to face mask oxgen  Post-op Assessment: Report given to PACU RN and Post -op Vital signs reviewed and stable  Post vital signs: Reviewed and stable  Complications: No apparent anesthesia complications

## 2013-02-22 NOTE — Anesthesia Preprocedure Evaluation (Addendum)
Anesthesia Evaluation  Patient identified by MRN, date of birth, ID band Patient awake    Reviewed: Allergy & Precautions, H&P , NPO status , Patient's Chart, lab work & pertinent test results  Airway Mallampati: II TM Distance: >3 FB Neck ROM: Full    Dental  (+) Dental Advisory Given, Missing and Poor Dentition   Pulmonary neg pulmonary ROS,  breath sounds clear to auscultation        Cardiovascular hypertension, Pt. on medications Rhythm:Regular Rate:Normal     Neuro/Psych negative neurological ROS  negative psych ROS   GI/Hepatic Neg liver ROS, GERD-  Medicated,  Endo/Other  negative endocrine ROS  Renal/GU negative Renal ROS     Musculoskeletal negative musculoskeletal ROS (+)   Abdominal   Peds  Hematology negative hematology ROS (+)   Anesthesia Other Findings   Reproductive/Obstetrics negative OB ROS                          Anesthesia Physical Anesthesia Plan  ASA: II  Anesthesia Plan: General   Post-op Pain Management:    Induction: Intravenous  Airway Management Planned: Oral ETT  Additional Equipment:   Intra-op Plan:   Post-operative Plan: Extubation in OR  Informed Consent: I have reviewed the patients History and Physical, chart, labs and discussed the procedure including the risks, benefits and alternatives for the proposed anesthesia with the patient or authorized representative who has indicated his/her understanding and acceptance.   Dental advisory given  Plan Discussed with: CRNA  Anesthesia Plan Comments:         Anesthesia Quick Evaluation

## 2013-02-22 NOTE — Progress Notes (Signed)
PHARMACIST - PHYSICIAN COMMUNICATION  CONCERNING: P&T Medication Policy Regarding Oral Bisphosphonates  RECOMMENDATION: Your order for alendronate (Fosamax), ibandronate (Boniva), or risedronate (Actonel) has been discontinued at this time.  If the patient's post-hospital medical condition warrants safe use of this class of drugs, please resume the pre-hospital regimen upon discharge.  DESCRIPTION:  Alendronate (Fosamax), ibandronate (Boniva), and risedronate (Actonel) can cause severe esophageal erosions in patients who are unable to remain upright at least 30 minutes after taking this medication.   Since brief interruptions in therapy are thought to have minimal impact on bone mineral density, the Pharmacy & Therapeutics Committee has established that bisphosphonate orders should be routinely discontinued during hospitalization.   To override this safety policy and permit administration of Boniva, Fosamax, or Actonel in the hospital, prescribers must write "DO NOT HOLD" in the comments section when placing the order for this class of medications.   Clance Boll, PharmD, BCPS Pager: [phone] 02/22/2013 5:35 PM

## 2013-02-23 ENCOUNTER — Encounter (HOSPITAL_COMMUNITY): Payer: Self-pay | Admitting: Orthopedic Surgery

## 2013-02-23 MED ORDER — CALCIUM CARBONATE ANTACID 500 MG PO CHEW
2.0000 | CHEWABLE_TABLET | ORAL | Status: DC | PRN
  Administered 2013-02-23: 400 mg via ORAL
  Filled 2013-02-23 (×2): qty 2

## 2013-02-23 MED ORDER — ALUM & MAG HYDROXIDE-SIMETH 200-200-20 MG/5ML PO SUSP
30.0000 mL | Freq: Four times a day (QID) | ORAL | Status: DC | PRN
  Administered 2013-02-23 (×2): 30 mL via ORAL
  Filled 2013-02-23 (×2): qty 30

## 2013-02-23 NOTE — Op Note (Signed)
NAMENEVAYA, NAGELE                   ACCOUNT NO.:  [id number]  MEDICAL RECORD NO.:  [id number]  LOCATION:  1616                         FACILITY:  [facility]  PHYSICIAN:  Georges Lynch. Jonesha Tsuchiya, M.D.DATE OF BIRTH:  08-07-1945  DATE OF PROCEDURE:  02/22/2013 DATE OF DISCHARGE:                              OPERATIVE REPORT   PREOPERATIVE DIAGNOSIS:  Severe spinal stenosis with a complete block involving L3-4 and L4-5, 2 levels.  POSTOPERATIVE DIAGNOSIS:  Severe spinal stenosis with a complete block involving L3-4 and L4-5, 2 levels.  PROCEDURE: 1. Complete decompressive lumbar laminectomy at L3-4 and L4-5. 2. Foraminotomies for 2 nerve roots bilaterally at L3-4 and L4-5 to     free up the L4 and the L5 root.  SURGEON:  Georges Lynch. Darrelyn Hillock, M.D.  ASSISTANT:  Marlowe Kays, M.D.  DESCRIPTION OF PROCEDURE:  Under general anesthesia, routine orthopedic prep and draping the lower back was carried out.  The appropriate time- out was carried out.  I also did not need to mark the side of the back, as she has had bilateral leg pain, we are going  centrally.  At this time, after the time-out 2 needles were placed in the back for localization purposes.  X-ray was taken to verify the position. Incision then was made over the L3-4, L4-5 space bleeders identified and cauterized.  I then separated the muscle bilaterally from the lamina and spinous processes.  Another x-ray was taken with 2 instruments in place. Following that, I went down and started my central decompressive lumbar laminectomy by removing the spinous process of L4.  I removed a part of L3 and a part of L5.  I then went down and did a complete lumbar laminectomies and then exposed the dura.  Great care was taken not to injure the dura.  I brought the microscope in.  Following that, we noted a complete thickened ligamentum flavum at L4-5so we first proceeded proximally to go near the L3-4 level, and then distal to the L4-5 level keeping  the ligamentum flavum in place because of the scar tissue present and the spondylolisthesis.  After this, we thoroughly protected the dura at all time, went out far laterally, out in the lateral recesses and then into the foramina and freed up the root.  I then protected the dura with the cottonoids and then elevated the scar ligamentum flavum.  We had a nice dissection removing that and then removed the ligamentum flavum in the usual fashion.  We now had complete freedom distally, proximally and out into both foramina bilaterally with a hockey-stick.  There was no real true herniated disk.  In this case, she had a hard disk at L4-5.  No diskectomy was necessary.  I thoroughly irrigated out the area.  Reexplored it with a hockey-stick and now the roots were totally free.  The dura was free distally and the dura was freed proximally.  We did not need to do any further decompression.  I then loosely applied some thrombin-soaked Gelfoam, closed the wound in layers usual fashion.  I left a small deep distal and proximal part of the wound open for drainage purposes.  I then  injected 20 mL of Exparel into the soft tissue.  I closed the subcutaneous with #1 Vicryl and the skin with metal staples and a sterile Neosporin dressing was applied. She had 2 g of IV Ancef preop.          ______________________________ Georges Lynch. Darrelyn Hillock, M.D.     RAG/MEDQ  D:  02/22/2013  T:  02/22/2013  Job:  [id number]

## 2013-02-23 NOTE — Progress Notes (Signed)
   Subjective: 1 Day Post-Op Procedure(s) (LRB): LUMBAR LAMINECTOMY/COMPLETE DECOMPRESSION L4 - L5 1 LEVEL (N/A) Patient reports pain as moderate.   Patient seen in rounds without Dr. Darrelyn Hillock. Patient is well, but has had some minor complaints of reflux symptoms. She says that she has been having some trouble with heartburn since surgery. She reports that it is somewhat better this morning. She denies shortness of breath and chest pain. She has some discomfort in her low back but no leg pain.  Plan is to go Home after hospital stay.  Objective: Vital signs in last 24 hours: Temp:  [97.5 F (36.4 C)-99.1 F (37.3 C)] 98.9 F (37.2 C) (07/23 0645) Pulse Rate:  [66-96] 72 (07/23 0645) Resp:  [12-18] 16 (07/23 0645) BP: (123-166)/(72-83) 123/74 mmHg (07/23 0645) SpO2:  [97 %-100 %] 98 % (07/23 0645) Weight:  [70.308 kg (155 lb)] 70.308 kg (155 lb) (07/22 1700)  Intake/Output from previous day:  Intake/Output Summary (Last 24 hours) at 02/23/13 0756 Last data filed at 02/23/13 0600  Gross per 24 hour  Intake   3105 ml  Output   2850 ml  Net    255 ml     EXAM General - Patient is Alert and Oriented Extremity - Neurologically intact Neurovascular intact Dorsiflexion/Plantar flexion intact No cellulitis present Dressing/Incision - clean, dry, no drainage Motor Function - intact, moving foot and toes well on exam.   Past Medical History  Diagnosis Date  . Hypertension   . Hyperlipidemia   . Numbness     RT LEG  . Spinal stenosis   . Arthritis   . Osteoporosis   . GERD (gastroesophageal reflux disease)   . Nocturia   . Sleeping difficulty     TAKES AMBIEN PRN    Assessment/Plan: 1 Day Post-Op Procedure(s) (LRB): LUMBAR LAMINECTOMY/COMPLETE DECOMPRESSION L4 - L5 1 LEVEL (N/A) Active Problems:   Spinal stenosis, lumbar region, with neurogenic claudication  Estimated body mass index is 28.34 kg/(m^2) as calculated from the following:   Height as of this encounter: 5'  2" (1.575 m).   Weight as of this encounter: 70.308 kg (155 lb). Advance diet Up with therapy Plan for discharge tomorrow DC foley when up Weight-Bearing as tolerated   She is doing well this morning. We will get her up with therapy to walk. Will continue to monitor today and plan for discharge hoe tomorrow.   Gretta Samons LAUREN 02/23/2013, 7:56 AM

## 2013-02-23 NOTE — Evaluation (Signed)
Occupational Therapy Evaluation Patient Details Name: Kristy Pope MRN: [id number] DOB: 09-14-1945 Today's Date: 02/23/2013 Time: 4098-1191 OT Time Calculation (min): 26 min  OT Assessment / Plan / Recommendation History of present illness pt admitted with L3-4, 4-5 decompression   Clinical Impression   Pt was admitted for 2 level decompression.  She was independent with adls prior to admission and will have son's help 24/7 for 2 weeks.  He lives with her but works.  At time of eval, pt was experiencing slow processing of information and will need reinforcement with education.  She will benefit from continued OT.      OT Assessment  Patient needs continued OT Services    Follow Up Recommendations  Home health OT [facility] aide)    Barriers to Discharge      Equipment Recommendations  3 in 1 bedside comode    Recommendations for Other Services    Frequency  Min 2X/week    Precautions / Restrictions Precautions Precautions: Back Restrictions Weight Bearing Restrictions: No   Pertinent Vitals/Pain Back 7/8 of 10.  Repositioned in bed    ADL  Grooming: Set up Where Assessed - Grooming: Supported sitting Upper Body Bathing: Supervision/safety Where Assessed - Upper Body Bathing: Unsupported sitting Lower Body Bathing: Maximal assistance Where Assessed - Lower Body Bathing: Supported sit to stand Upper Body Dressing: Minimal assistance Where Assessed - Upper Body Dressing: Supported sitting Lower Body Dressing: Maximal assistance (with AE) Where Assessed - Lower Body Dressing: Supported sit to Pharmacist, hospital: Minimal assistance (extra time) Statistician Method: Surveyor, minerals:  (simulated:  chair to bed) Toileting - Clothing Manipulation and Hygiene: Moderate assistance Where Assessed - Toileting Clothing Manipulation and Hygiene: Standing Equipment Used: Reacher;Rolling walker;Sock aid Transfers/Ambulation Related to ADLs: Pt did well with mobility  but extra time required ADL Comments: Pt processing slowly, including answering PLOF questions.  Introduced AE but pt will need reinforcment.  After educating on back precautions, able to recall 1/3    OT Diagnosis: Generalized weakness  OT Problem List: Decreased strength;Decreased activity tolerance;Decreased cognition;Decreased knowledge of use of DME or AE;Decreased knowledge of precautions;Pain OT Treatment Interventions: Self-care/ADL training;DME and/or AE instruction;Patient/family education   OT Goals(Current goals can be found in the care plan section) Acute Rehab OT Goals Patient Stated Goal: none stated OT Goal Formulation: With patient Time For Goal Achievement: 03/02/13 Potential to Achieve Goals: Good ADL Goals Pt Will Perform Grooming: with supervision;standing Pt Will Perform Lower Body Bathing: with supervision;with adaptive equipment;sit to/from stand Pt Will Perform Lower Body Dressing: with min assist;with adaptive equipment;sit to/from stand Pt Will Transfer to Toilet: with supervision;ambulating;bedside commode Pt Will Perform Toileting - Clothing Manipulation and hygiene: with supervision;with adaptive equipment;sit to/from stand Additional ADL Goal #1: Pt will recall 3/3 back precautions  Visit Information  Last OT Received On: 02/23/13 Assistance Needed: +1 History of Present Illness: pt admitted with L3-4, 4-5 decompression       Prior Functioning     Home Living Family/patient expects to be discharged to:: Private residence Living Arrangements: Children Additional Comments: son will be home for a couple of weeks Prior Function Level of Independence: Independent Communication Communication: No difficulties (slow processing)         Vision/Perception     Cognition  Cognition Arousal/Alertness: Awake/alert Behavior During Therapy: WFL for tasks assessed/performed Overall Cognitive Status: Impaired/Different from baseline Memory:  (slow  processing)    Extremity/Trunk Assessment Upper Extremity Assessment Upper Extremity Assessment: Overall WFL for tasks  assessed (to 90 degrees; h/o shoulder sx)     Mobility Bed Mobility Bed Mobility: Supine to Sit;Sit to Supine Sit to Supine: 3: Mod assist Details for Bed Mobility Assistance: assist for LEs, cues for technique Transfers Transfers: Sit to Stand;Stand to Sit Sit to Stand: 4: Min assist Details for Transfer Assistance: cues for hand placement     Exercise     Balance     End of Session OT - End of Session Activity Tolerance: Patient limited by fatigue;Patient limited by pain Patient left: in bed;with call bell/phone within reach;with family/visitor present Nurse Communication:  (reflux)  GO     Kristy Pope 02/23/2013, 10:31 AM Marica Otter, OTR/L [phone] 02/23/2013

## 2013-02-23 NOTE — Progress Notes (Signed)
Met with pt at bedside to discuss d/c planning. She has chosen [facility] for any [facility] services needed. She stated her son will be there with her but she isn't sure how helpful he will be so she will be looking for someone else to assist her. She says she already a RW but wants the 3 in 1 recommended by OT and is willing to pay for it if it's not covered by her insurance. I will follow up with pt tomorrow.  Algernon Huxley, RN BSN   [phone]

## 2013-02-23 NOTE — Evaluation (Signed)
Physical Therapy Evaluation Patient Details Name: Quantina Dershem MRN: [id number] DOB: 1946-07-01 Today's Date: 02/23/2013 Time: 0900-0930 PT Time Calculation (min): 30 min  PT Assessment / Plan / Recommendation History of Present Illness  pt admitted with L3-4, 4-5 decompression  Clinical Impression  Pt s/p L3-4, 4-5 decompression presents with functional mobility limitations 2* post op pain and back precautions.    PT Assessment  Patient needs continued PT services    Follow Up Recommendations  No PT follow up;Home health PT (dependent on acute stay progress and son's ability to assist)    Does the patient have the potential to tolerate intense rehabilitation      Barriers to Discharge        Equipment Recommendations  Rolling walker with 5" wheels    Recommendations for Other Services OT consult   Frequency Min 6X/week    Precautions / Restrictions Precautions Precautions: Back Restrictions Weight Bearing Restrictions: No   Pertinent Vitals/Pain 5/10 back pain - no LE pain reported.  Premed      Mobility  Bed Mobility Bed Mobility: Supine to Sit;Sit to Supine Rolling Right: 3: Mod assist Supine to Sit: 3: Mod assist Sit to Supine: 3: Mod assist Details for Bed Mobility Assistance: assist for LEs, cues for technique Transfers Transfers: Sit to Stand;Stand to Sit Sit to Stand: 4: Min assist Stand to Sit: 3: Mod assist;4: Min assist Details for Transfer Assistance: cues for hand placement Ambulation/Gait Ambulation/Gait Assistance: 3: Mod assist;4: Min assist Ambulation Distance (Feet): 280 Feet Assistive device: Rolling walker Ambulation/Gait Assistance Details: cues for posture and position from RW Gait Pattern: Step-to pattern;Step-through pattern;Decreased step length - right;Decreased step length - left;Trunk flexed General Gait Details: progressed from short shuffling step to normal step and recip gait Stairs: No    Exercises     PT Diagnosis: Difficulty  walking  PT Problem List: Decreased strength;Decreased activity tolerance;Decreased balance;Decreased knowledge of use of DME;Decreased safety awareness;Obesity;Pain;Decreased knowledge of precautions PT Treatment Interventions: DME instruction;Gait training;Stair training;Functional mobility training;Therapeutic activities;Patient/family education     PT Goals(Current goals can be found in the care plan section) Acute Rehab PT Goals Patient Stated Goal: none stated PT Goal Formulation: With patient Time For Goal Achievement: 03/02/13 Potential to Achieve Goals: Good  Visit Information  Last PT Received On: 02/23/13 Assistance Needed: +1 History of Present Illness: pt admitted with L3-4, 4-5 decompression       Prior Functioning  Home Living Family/patient expects to be discharged to:: Private residence Living Arrangements: Children Available Help at Discharge: Family Type of Home: Mobile home Home Access: Stairs to enter Secretary/administrator of Steps: 3 Entrance Stairs-Rails: None Home Layout: One level Home Equipment: Environmental consultant - 2 wheels Additional Comments: son will be home for a couple of weeks Prior Function Level of Independence: Independent Comments: I was going to the [employer] Communication: No difficulties (slow processing)    Cognition  Cognition Arousal/Alertness: Awake/alert Behavior During Therapy: WFL for tasks assessed/performed Overall Cognitive Status: Impaired/Different from baseline Memory:  (slow processing)    Extremity/Trunk Assessment Upper Extremity Assessment Upper Extremity Assessment: Overall WFL for tasks assessed (to 90 degrees; h/o shoulder sx) Lower Extremity Assessment Lower Extremity Assessment: Overall WFL for tasks assessed   Balance    End of Session PT - End of Session Activity Tolerance: Patient tolerated treatment well Patient left: in chair;with call bell/phone within reach Nurse Communication: Mobility status  GP      Corby Vandenberghe 02/23/2013, 11:06 AM

## 2013-02-23 NOTE — Progress Notes (Signed)
Physical Therapy Treatment Patient Details Name: Kristy Pope MRN: [id number] DOB: 1946/02/08 Today's Date: 02/23/2013 Time: 0102-7253 PT Time Calculation (min): 33 min  PT Assessment / Plan / Recommendation  History of Present Illness     Clinical Impression    PT Comments     Follow Up Recommendations  No PT follow up;Home health PT     Does the patient have the potential to tolerate intense rehabilitation     Barriers to Discharge        Equipment Recommendations  Rolling walker with 5" wheels    Recommendations for Other Services OT consult  Frequency Min 6X/week   Progress towards PT Goals    Plan Current plan remains appropriate    Precautions / Restrictions Precautions Precautions: Back Restrictions Weight Bearing Restrictions: No   Pertinent Vitals/Pain 6/10; premed    Mobility  Bed Mobility Bed Mobility: Supine to Sit;Sit to Supine Rolling Right: 4: Min assist Supine to Sit: 4: Min assist;3: Mod assist Sit to Supine: 4: Min assist;3: Mod assist Details for Bed Mobility Assistance: assist for LEs, cues for technique Transfers Transfers: Sit to Stand;Stand to Sit Sit to Stand: 4: Min assist Stand to Sit: 4: Min assist Details for Transfer Assistance: cues for hand placement Ambulation/Gait Ambulation/Gait Assistance: 4: Min assist Ambulation Distance (Feet): 280 Feet Assistive device: Rolling walker Ambulation/Gait Assistance Details: cues for posture and position from RW Gait Pattern: Step-to pattern;Step-through pattern;Decreased step length - right;Decreased step length - left;Trunk flexed Stairs: No    Exercises     PT Diagnosis:    PT Problem List:   PT Treatment Interventions:     PT Goals (current goals can now be found in the care plan section) Acute Rehab PT Goals Patient Stated Goal: none stated PT Goal Formulation: With patient Time For Goal Achievement: 03/02/13 Potential to Achieve Goals: Good  Visit Information  Last PT Received  On: 02/23/13 Assistance Needed: +1    Subjective Data  Subjective: I'll walk but then I want a nap Patient Stated Goal: none stated   Cognition  Cognition Arousal/Alertness: Awake/alert Behavior During Therapy: WFL for tasks assessed/performed Overall Cognitive Status: Within Functional Limits for tasks assessed    Balance     End of Session PT - End of Session Activity Tolerance: Patient tolerated treatment well Patient left: in bed;with call bell/phone within reach Nurse Communication: Mobility status   GP     Kristy Pope 02/23/2013, 3:57 PM

## 2013-02-23 NOTE — Progress Notes (Signed)
Utilization review completed.  

## 2013-02-24 MED ORDER — METHOCARBAMOL 500 MG PO TABS: 500.0000 mg | ORAL_TABLET | Freq: Four times a day (QID) | ORAL | Status: DC | PRN

## 2013-02-24 MED ORDER — OXYCODONE HCL 5 MG PO TABS: 5.0000 mg | ORAL_TABLET | ORAL | Status: DC | PRN

## 2013-02-24 NOTE — Progress Notes (Signed)
Occupational Therapy Treatment Patient Details Name: Kristy Pope MRN: [id number] DOB: February 04, 1946 Today's Date: 02/24/2013 Time: 7829-562 and 854-930 OT Time Calculation (min): 41 min  OT Assessment / Plan / Recommendation  History of present illness pt admitted with L3-4, 4-5 decompression   Clinical Impression    OT comments  Pt making slow progress with adls.  Needs cues for back precautions and needs further instruction with AE   Follow Up Recommendations  Home health OT (with HH aide vs. STSNF)    Barriers to Discharge       Equipment Recommendations  3 in 1 bedside comode    Recommendations for Other Services    Frequency Min 2X/week   Progress towards OT Goals Progress towards OT goals: Progressing toward goals  Plan Discharge plan needs to be updated    Precautions / Restrictions Precautions Precautions: Back Restrictions Weight Bearing Restrictions: No   Pertinent Vitals/Pain 5/10 back repositioned.  Pt was premedicated    ADL  Lower Body Bathing: Moderate assistance (with reacher) Where Assessed - Lower Body Bathing: Supported sit to stand Lower Body Dressing: Moderate assistance (with AE pants and socks only) Where Assessed - Lower Body Dressing: Supported sit to stand Toilet Transfer: Minimal assistance Toilet Transfer Method: Surveyor, minerals:  (simulated bed to chair) Toileting - Clothing Manipulation and Hygiene: Minimal assistance Where Assessed - Engineer, mining and Hygiene: Sit to stand from 3-in-1 or toilet Equipment Used: Reacher;Rolling walker;Sock aid;Long-handled shoe horn Transfers/Ambulation Related to ADLs: pt was sitting eob when OT arrived.  She needs cues for technique ADL Comments: Pt only recalled 1/3 back precautions.  Worked through ADL with AE:  unable to put tennis shoes on and only has those and flip flops.  Recommended she uses gripper socks.  Pt needs reinforcement with AE.  Pt has some intermittent  supervision.  Son works 12 hour shifts. Has some friends helping but she doesn't want to ask them to do too much.   Recommend HHOT and aide vs. STSNF    OT Diagnosis:    OT Problem List:   OT Treatment Interventions:     OT Goals(current goals can now be found in the care plan section)    Visit Information  Last OT Received On: 02/24/13 Assistance Needed: +1 History of Present Illness: pt admitted with L3-4, 4-5 decompression    Subjective Data      Prior Functioning  Home Living Family/patient expects to be discharged to:: Private residence    Cognition  Cognition Arousal/Alertness: Awake/alert Behavior During Therapy: WFL for tasks assessed/performed Overall Cognitive Status: Within Functional Limits for tasks assessed    Mobility  Transfers Sit to Stand: 4: Min assist;With upper extremity assist;From bed Details for Transfer Assistance: cues forback precautions    Exercises      Balance     End of Session OT - End of Session Activity Tolerance: Patient limited by fatigue Patient left: in chair;with call bell/phone within reach  GO     [facility] 02/24/2013, 9:44 AM Marica Otter, OTR/L [phone] 02/24/2013

## 2013-02-24 NOTE — Progress Notes (Signed)
Clarification on HH/PT. No HH/PT needed per Dimitri Ped PA/C

## 2013-02-24 NOTE — Progress Notes (Signed)
Subjective: 2 Days Post-Op Procedure(s) (LRB): LUMBAR LAMINECTOMY/COMPLETE DECOMPRESSION L4 - L5 1 LEVEL (N/A) Patient reports pain as 2 on 0-10 scale.  Doing well today. Dressing changed and wound looks fine.  Objective: Vital signs in last 24 hours: Temp:  [99 F (37.2 C)-102.5 F (39.2 C)] 99.5 F (37.5 C) (07/24 0510) Pulse Rate:  [75-124] 88 (07/24 0510) Resp:  [16] 16 (07/24 0510) BP: (107-146)/(59-82) 107/63 mmHg (07/24 0510) SpO2:  [94 %-97 %] 94 % (07/24 0510)  Intake/Output from previous day: 07/23 0701 - 07/24 0700 In: 1070 [P.O.:600; I.V.:320; IV Piggyback:150] Out: 1475 [Urine:1475] Intake/Output this shift: Total I/O In: -  Out: 100 [Urine:100]  No results found for this basename: HGB,  in the last 72 hours No results found for this basename: WBC, RBC, HCT, PLT,  in the last 72 hours No results found for this basename: NA, K, CL, CO2, BUN, CREATININE, GLUCOSE, CALCIUM,  in the last 72 hours No results found for this basename: LABPT, INR,  in the last 72 hours  Neurovascular intact No cellulitis present  Assessment/Plan: 2 Days Post-Op Procedure(s) (LRB): LUMBAR LAMINECTOMY/COMPLETE DECOMPRESSION L4 - L5 1 LEVEL (N/A) Discharge home with home health  Kristy Pope A 02/24/2013, 7:18 AM

## 2013-02-24 NOTE — Progress Notes (Signed)
Physical Therapy Treatment Patient Details Name: Bryanna Yim MRN: [id number] DOB: 1945-10-11 Today's Date: 02/24/2013 Time: 6578-4696 PT Time Calculation (min): 34 min  PT Assessment / Plan / Recommendation  History of Present Illness pt admitted with L3-4, 4-5 decompression   Clinical Impression    PT Comments   Pt progressing toward independence but continues to require repeated cues for technique, safety and adherence to back precautions.  Pt would benefit from follow up HHPT.  Follow Up Recommendations  Home health PT     Does the patient have the potential to tolerate intense rehabilitation     Barriers to Discharge        Equipment Recommendations  None recommended by PT    Recommendations for Other Services OT consult  Frequency Min 6X/week   Progress towards PT Goals Progress towards PT goals: Progressing toward goals  Plan Current plan remains appropriate    Precautions / Restrictions Precautions Precautions: Back Precaution Comments: pt recalls 1/3 back precautions without cues - all precautions reviewed Restrictions Weight Bearing Restrictions: No   Pertinent Vitals/Pain 6/10; premed   Mobility  Bed Mobility Bed Mobility: Supine to Sit;Sit to Supine Rolling Right: 4: Min guard;5: Supervision Supine to Sit: 4: Min guard;5: Supervision Sit to Supine: 4: Min guard;5: Supervision Details for Bed Mobility Assistance: multimodal cues for correct sequence and log roll technique - pt progressed to Sup with several repetitions of task Transfers Transfers: Sit to Stand;Stand to Sit Sit to Stand: 4: Min guard;5: Supervision Stand to Sit: 4: Min guard;5: Supervision Details for Transfer Assistance: cues forback precautions Ambulation/Gait Ambulation/Gait Assistance: 4: Min guard;5: Supervision Ambulation Distance (Feet): 123 Feet Assistive device: Rolling walker Ambulation/Gait Assistance Details: cues for posture and position from RW Gait Pattern: Step-to  pattern;Step-through pattern;Decreased step length - right;Decreased step length - left;Trunk flexed Stairs: Yes Stairs Assistance: 4: Min assist Stairs Assistance Details (indicate cue type and reason): cues for sequence and type of assistance needed Stair Management Technique: One rail Left;Step to pattern;Forwards;Other (comment) (HHA) Number of Stairs: 4    Exercises     PT Diagnosis:    PT Problem List:   PT Treatment Interventions:     PT Goals (current goals can now be found in the care plan section) Acute Rehab PT Goals Patient Stated Goal: none stated PT Goal Formulation: With patient Time For Goal Achievement: 03/02/13 Potential to Achieve Goals: Good  Visit Information  Last PT Received On: 02/24/13 Assistance Needed: +1 History of Present Illness: pt admitted with L3-4, 4-5 decompression    Subjective Data  Subjective: Its just so sore Patient Stated Goal: none stated   Cognition  Cognition Arousal/Alertness: Awake/alert Behavior During Therapy: WFL for tasks assessed/performed Overall Cognitive Status: Within Functional Limits for tasks assessed    Balance     End of Session PT - End of Session Activity Tolerance: Patient tolerated treatment well Patient left: in chair;with call bell/phone within reach;with family/visitor present Nurse Communication: Mobility status   GP     Florice Hindle 02/24/2013, 12:48 PM

## 2013-02-25 NOTE — Progress Notes (Signed)
Utilization review completed.  

## 2013-02-28 NOTE — Discharge Summary (Signed)
Physician Discharge Summary   Patient ID: Kristy Pope MRN: [id number] DOB/AGE: 04-30-1946 67 y.o.  Admit date: 02/22/2013 Discharge date: 02/24/2013  Primary Diagnosis: Lumbar spinal stenosis  Admission Diagnoses:  Past Medical History  Diagnosis Date  . Hypertension   . Hyperlipidemia   . Numbness     RT LEG  . Spinal stenosis   . Arthritis   . Osteoporosis   . GERD (gastroesophageal reflux disease)   . Nocturia   . Sleeping difficulty     TAKES AMBIEN PRN   Discharge Diagnoses:   Active Problems:   Spinal stenosis, lumbar region, with neurogenic claudication  Estimated body mass index is 28.34 kg/(m^2) as calculated from the following:   Height as of this encounter: 5\' 2"  (1.575 m).   Weight as of this encounter: 70.308 kg (155 lb).  Procedure:  Procedure(s) (LRB): LUMBAR LAMINECTOMY/COMPLETE DECOMPRESSION L4 - L5 1 LEVEL (N/A)   Consults: None  HPI: Kristy Pope presented with the chief complaint of low back pain with no known injury. She has been dealing with this for 2-3 years. She denies numbness and tingling in the legs but has pain radiating into the both legs, left greater than right. No change in bladder or bowel function. MRI showed the she has some spinal stenosis at 4-5 with recess stenosis. She also has facet arthrosis at L5-S1. CT myelogram revealed that she has a complete block at L4-5.     Laboratory Data: Hospital Outpatient Visit on 02/16/2013  Component Date Value Range Status  . WBC 02/16/2013 7.9  4.0 - 10.5 K/uL Final  . RBC 02/16/2013 4.71  3.87 - 5.11 MIL/uL Final  . Hemoglobin 02/16/2013 13.9  12.0 - 15.0 g/dL Final  . HCT 78/29/5621 41.7  36.0 - 46.0 % Final  . MCV 02/16/2013 88.5  78.0 - 100.0 fL Final  . MCH 02/16/2013 29.5  26.0 - 34.0 pg Final  . MCHC 02/16/2013 33.3  30.0 - 36.0 g/dL Final  . RDW 30/86/5784 13.9  11.5 - 15.5 % Final  . Platelets 02/16/2013 369  150 - 400 K/uL Final  . MRSA, PCR 02/16/2013 NEGATIVE  NEGATIVE Final  .  Staphylococcus aureus 02/16/2013 NEGATIVE  NEGATIVE Final   Comment:                                 The Xpert SA Assay (FDA                          approved for NASAL specimens                          in patients over 60 years of age),                          is one component of                          a comprehensive surveillance                          program.  Test performance has                          been validated by [employer] [facility] for patients greater                          than or equal to 88 year old.                          It is not intended                          to diagnose infection nor to                          guide or monitor treatment.  Marland Kitchen aPTT 02/16/2013 29  24 - 37 seconds Final  . Sodium 02/16/2013 140  135 - 145 mEq/L Final  . Potassium 02/16/2013 4.3  3.5 - 5.1 mEq/L Final  . Chloride 02/16/2013 105  96 - 112 mEq/L Final  . CO2 02/16/2013 24  19 - 32 mEq/L Final  . Glucose, Bld 02/16/2013 120* 70 - 99 mg/dL Final  . BUN 16/05/9603 12  6 - 23 mg/dL Final  . Creatinine, Ser 02/16/2013 0.67  0.50 - 1.10 mg/dL Final  . Calcium 54/04/8118 10.0  8.4 - 10.5 mg/dL Final  . Total Protein 02/16/2013 7.8  6.0 - 8.3 g/dL Final  . Albumin 14/78/2956 4.4  3.5 - 5.2 g/dL Final  . AST 21/30/8657 25  0 - 37 U/L Final  . ALT 02/16/2013 21  0 - 35 U/L Final  . Alkaline Phosphatase 02/16/2013 145* 39 - 117 U/L Final  . Total Bilirubin 02/16/2013 0.4  0.3 - 1.2 mg/dL Final  . GFR calc non Af Amer 02/16/2013 88* >90 mL/min Final  . GFR calc Af Amer 02/16/2013 >90  >90 mL/min Final   Comment:                                 The eGFR has been calculated                          using the CKD EPI equation.                          This calculation has not been                          validated in all clinical                          situations.                          eGFR's persistently                          <90 mL/min signify                           possible Chronic Kidney Disease.  Marland Kitchen Prothrombin Time 02/16/2013 12.5  11.6 - 15.2 seconds Final  . INR 02/16/2013 0.95  0.00 - 1.49 Final  . Color, Urine 02/16/2013 YELLOW  YELLOW Final  . APPearance 02/16/2013 CLEAR  CLEAR Final  . Specific Gravity, Urine  02/16/2013 1.030  1.005 - 1.030 Final  . pH 02/16/2013 7.0  5.0 - 8.0 Final  . Glucose, UA 02/16/2013 NEGATIVE  NEGATIVE mg/dL Final  . Hgb urine dipstick 02/16/2013 NEGATIVE  NEGATIVE Final  . Bilirubin Urine 02/16/2013 NEGATIVE  NEGATIVE Final  . Ketones, ur 02/16/2013 NEGATIVE  NEGATIVE mg/dL Final  . Protein, ur 45/40/9811 NEGATIVE  NEGATIVE mg/dL Final  . Urobilinogen, UA 02/16/2013 1.0  0.0 - 1.0 mg/dL Final  . Nitrite 91/47/8295 NEGATIVE  NEGATIVE Final  . Leukocytes, UA 02/16/2013 SMALL* NEGATIVE Final  . WBC, UA 02/16/2013 3-6  <3 WBC/hpf Final  . Bacteria, UA 02/16/2013 RARE  RARE Final  . Urine-Other 02/16/2013 MUCOUS PRESENT   Final     X-Rays:Dg Chest 2 View  02/16/2013   *RADIOLOGY REPORT*  Clinical Data: Spinal stenosis.  Preoperative evaluation.  CHEST - 2 VIEW  Comparison: One-view chest 02/20/2012.  Findings: The heart size is normal.  The lungs are clear.  The visualized soft tissues and bony thorax are unremarkable.  IMPRESSION: No acute cardiopulmonary disease or significant interval change.   Original Report Authenticated By: Marin Roberts, M.D.   Dg Lumbar Spine 2-3 Views  02/16/2013   *RADIOLOGY REPORT*  Clinical Data: Spinal stenosis.  LUMBAR SPINE - 2-3 VIEW  Comparison: Lumbar myelogram 01/20/2013.  Findings: Five non-rib bearing lumbar type vertebral bodies are present.  Slight anterolisthesis at L4-5 and to a lesser extent at L5-S1 is stable.  Vertebral body heights are maintained.  Chronic loss of disc height at L2-3 is stable.  Atherosclerotic calcifications are evident.  The vertebral body levels are labeled to correspond with the recent lumbar myelogram.  IMPRESSION:  1.  Stable  anterolisthesis at L4-5 and to a lesser extent L5-S1. 2.  No acute abnormality. 3.  Atherosclerosis.   Original Report Authenticated By: Marin Roberts, M.D.   Dg Spine Portable 1 View  02/22/2013   *RADIOLOGY REPORT*  Clinical Data: Spinal stenosis.  Please number for OR positioning.  PORTABLE SPINE - 1 VIEW  Comparison: Portable view of the spine 02/22/2013 and 1405 hours. CT lumbar spine 01/20/2013.  Findings: A single cross-table lateral view of the lower lumbar spine is submitted, and lumbar spine vertebral bodies are number on the image.  A metallic probe overlies the posterior elements of L4.  IMPRESSION: Intraoperative localization at the L4 level.   Original Report Authenticated By: Britta Mccreedy, M.D.   Dg Spine Portable 1 View  02/22/2013   *RADIOLOGY REPORT*  Clinical Data: Spinal stenosis.  PORTABLE SPINE - 1 VIEW  Comparison: 02/22/2013.  Findings: A single intraoperative cross-table lateral view of the lower lumbar spine is submitted for evaluation.  This demonstrates surgical instruments in place posterior to L5 and posterior to the inferior aspect of the L4 vertebral body.  IMPRESSION: 1.  Intraoperative localization, as above.   Original Report Authenticated By: Trudie Reed, M.D.   Dg Spine Portable 1 View  02/22/2013   *RADIOLOGY REPORT*  Clinical Data: Spinal stenosis.  OR localization.  PORTABLE SPINE - 1 VIEW  Comparison: 02/16/2013  Findings: Posterior surgical instruments are directed at the L5 and S1 levels.  IMPRESSION: Intraoperative localization as above.   Original Report Authenticated By: Charlett Nose, M.D.    EKG: Orders placed during the hospital encounter of 02/16/13  . EKG 12-LEAD  . EKG 12-LEAD     Hospital Course: Franshesca Jepsen is a 67 y.o. who was admitted to [facility]. They were brought to the operating room on  02/22/2013 and underwent Procedure(s): LUMBAR LAMINECTOMY/COMPLETE DECOMPRESSION L4 - L5 1 LEVEL.  Patient tolerated the procedure well  and was later transferred to the recovery room and then to the orthopaedic floor for postoperative care.  They were given PO and IV analgesics for pain control following their surgery.  They were given 24 hours of postoperative antibiotics of  Anti-infectives   Start     Dose/Rate Route Frequency Ordered Stop   02/22/13 2200  ceFAZolin (ANCEF) IVPB 1 g/50 mL premix     1 g 100 mL/hr over 30 Minutes Intravenous 3 times per day 02/22/13 1712 02/23/13 1430   02/22/13 1257  polymyxin B 500,000 Units, bacitracin 50,000 Units in sodium chloride irrigation 0.9 % 500 mL irrigation  Status:  Discontinued       As needed 02/22/13 1257 02/22/13 1541   02/22/13 1100  ceFAZolin (ANCEF) IVPB 2 g/50 mL premix     2 g 100 mL/hr over 30 Minutes Intravenous On call to O.R. 02/22/13 1021 02/22/13 1317     and started on DVT prophylaxis in the form of Aspirin.   PT was ordered for assistance with ambulation.  Discharge planning consulted to help with postop disposition and equipment needs.  Patient had a fair night on the evening of surgery.  They started to get up OOB with therapy on day one.  Continued to work with therapy into day two.  Dressing was changed on day two and the incision was clean and dry. Incision was healing well.  Patient was seen in rounds and was ready to go home.   Discharge Medications: Prior to Admission medications   Medication Sig Start Date End Date Taking? Authorizing Provider  acetaminophen (TYLENOL) 500 MG tablet Take 1,000 mg by mouth every 6 (six) hours as needed for pain.   Yes Historical Provider, MD  amLODipine (NORVASC) 10 MG tablet Take 10 mg by mouth every evening.   Yes Historical Provider, MD  atorvastatin (LIPITOR) 80 MG tablet Take 80 mg by mouth every evening.   Yes Historical Provider, MD  ezetimibe (ZETIA) 10 MG tablet Take 10 mg by mouth every evening.   Yes Historical Provider, MD  omeprazole (PRILOSEC) 20 MG capsule Take 20 mg by mouth daily.   Yes Historical  Provider, MD  zolpidem (AMBIEN) 10 MG tablet Take 10 mg by mouth at bedtime as needed for sleep.   Yes Historical Provider, MD  alendronate (FOSAMAX) 70 MG tablet Take 70 mg by mouth every 7 (seven) days. Take with a full glass of water on an empty stomach.    Historical Provider, MD  aspirin 81 MG tablet Take 81 mg by mouth daily.    Historical Provider, MD  methocarbamol (ROBAXIN) 500 MG tablet Take 1 tablet (500 mg total) by mouth every 6 (six) hours as needed. 02/24/13   Bernita Beckstrom Tamala Ser, PA-C  oxyCODONE (ROXICODONE) 5 MG immediate release tablet Take 1-2 tablets (5-10 mg total) by mouth every 4 (four) hours as needed for pain. 02/24/13   Neymar Dowe Tamala Ser, PA-C    Diet: Cardiac diet Activity:WBAT Follow-up:in 2 weeks Disposition - Home Discharged Condition: stable   Discharge Orders   Future Orders Complete By Expires     Call MD / Call 911  As directed     Comments:      If you experience chest pain or shortness of breath, CALL 911 and be transported to the hospital emergency room.  If you develope a fever above 101 F, pus (  white drainage) or increased drainage or redness at the wound, or calf pain, call your surgeon's office.    Constipation Prevention  As directed     Comments:      Drink plenty of fluids.  Prune juice may be helpful.  You may use a stool softener, such as Colace (over the counter) 100 mg twice a day.  Use MiraLax (over the counter) for constipation as needed.    Diet - low sodium heart healthy  As directed     Discharge instructions  As directed     Comments:      Change your dressing daily. Shower only, no tub bath. Call if any temperatures greater than 101 or any wound complications: [phone] during the day and ask for Dr. Jeannetta Ellis nurse, Mac[facility]. For the first few days, remove your dressing, tape a piece of saran wrap over your incision, take your shower, then remove the saran wrap and put a clean dressing on. After two days you can shower  without the saran wrap Walk with your walker    Driving restrictions  As directed     Comments:      No driving for 2 weeks    Increase activity slowly as tolerated  As directed     Lifting restrictions  As directed     Comments:      No lifting        Medication List    STOP taking these medications       HYDROcodone-acetaminophen 5-325 MG per tablet  Commonly known as:  NORCO/VICODIN      TAKE these medications       acetaminophen 500 MG tablet  Commonly known as:  TYLENOL  Take 1,000 mg by mouth every 6 (six) hours as needed for pain.     alendronate 70 MG tablet  Commonly known as:  FOSAMAX  Take 70 mg by mouth every 7 (seven) days. Take with a full glass of water on an empty stomach.     amLODipine 10 MG tablet  Commonly known as:  NORVASC  Take 10 mg by mouth every evening.     aspirin 81 MG tablet  Take 81 mg by mouth daily.     atorvastatin 80 MG tablet  Commonly known as:  LIPITOR  Take 80 mg by mouth every evening.     ezetimibe 10 MG tablet  Commonly known as:  ZETIA  Take 10 mg by mouth every evening.     methocarbamol 500 MG tablet  Commonly known as:  ROBAXIN  Take 1 tablet (500 mg total) by mouth every 6 (six) hours as needed.     omeprazole 20 MG capsule  Commonly known as:  PRILOSEC  Take 20 mg by mouth daily.     oxyCODONE 5 MG immediate release tablet  Commonly known as:  ROXICODONE  Take 1-2 tablets (5-10 mg total) by mouth every 4 (four) hours as needed for pain.     zolpidem 10 MG tablet  Commonly known as:  AMBIEN  Take 10 mg by mouth at bedtime as needed for sleep.           Follow-up Information   Follow up with GIOFFRE,RONALD A, MD. Schedule an appointment as soon as possible for a visit in 2 weeks.   Contact information:   [address] [place] [address] [phone]       Signed: Kerby Nora 02/28/2013, 8:47 AM

## 2013-07-31 IMAGING — RF DG MYELOGRAM LUMBAR
13 of 19 series · 13 of 19 positions shown · IV contrast (omnipaque)
Comparison: Outside MRI 11/24/2012.

MYELOGRAM INJECTION
TECHNIQUE: Informed consent was obtained from the patient prior to
the procedure, including potential complications of headache,
allergy, infection and pain. Specific instructions were given
regarding 24 hour bedrest post procedure to prevent post-LP
headache.  A timeout procedure was performed.  With the patient
prone, the lower back was prepped with Betadine.  1% Lidocaine was
used for local anesthesia.  Lumbar puncture was performed by the
radiologist at the L4 level using a 22 gauge needle with return of
clear CSF. I personally performed the lumbar puncture and
administered the intrathecal contrast. I also personally supervised
acquisition of the myelogram images. 15 cc of Omnipaque 180 was
injected into the subarachnoid space .
CLINICAL DATA: Low back pain.  Right leg pain.
TECHNIQUE: Multidetector CT imaging of the lumbar spine was
performed following myelography.  Multiplanar CT image
reconstructions were also generated.

[Series 2: (hospital) · 1 of 1 slices shown]
[im 1/1]
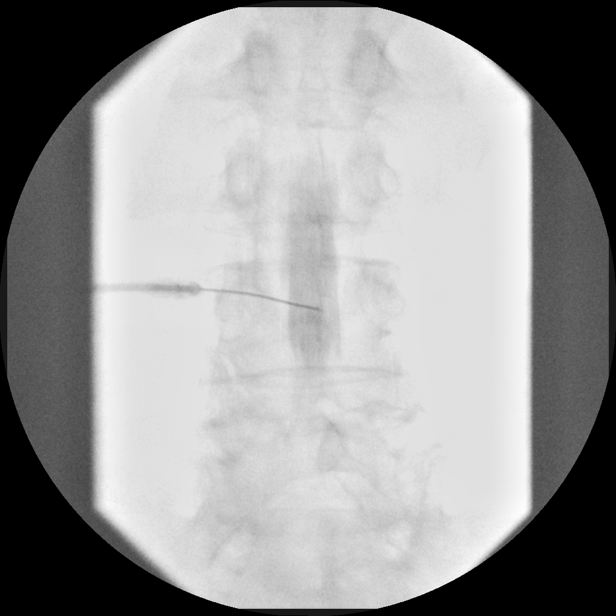

[Series 4: myelogram  white · 1 of 1 slices shown (1 of 9)]
[im 1/1]
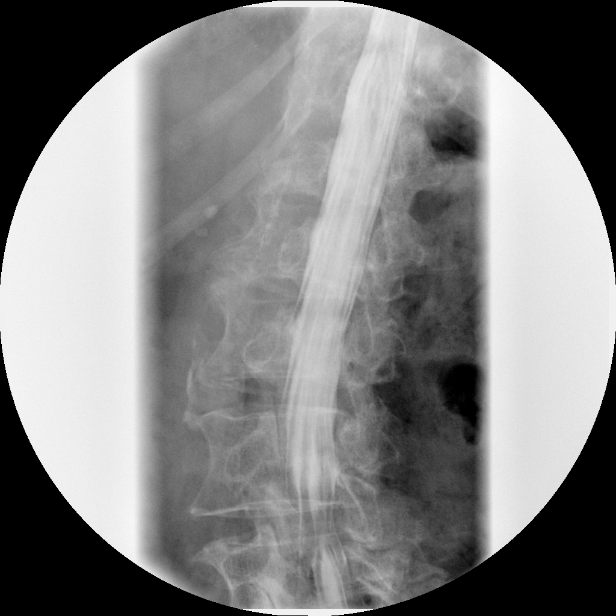

[Series 5: myelogram  white · 1 of 1 slices shown (2 of 9)]
[im 1/1]
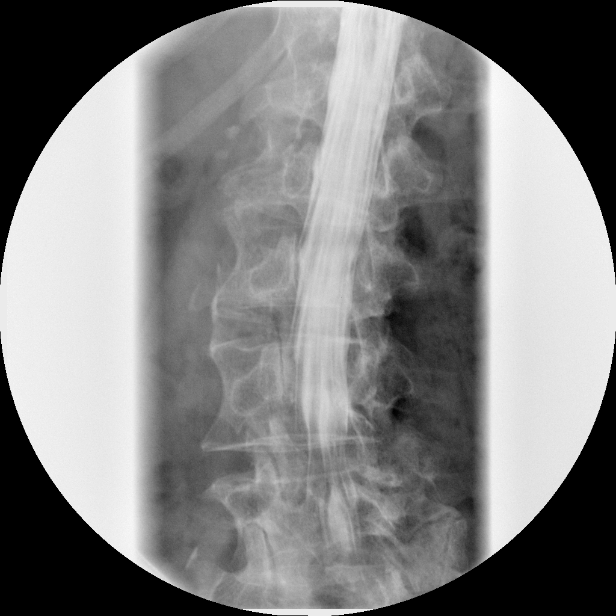

[Series 7: myelogram  white · 1 of 1 slices shown (3 of 9)]
[im 1/1]
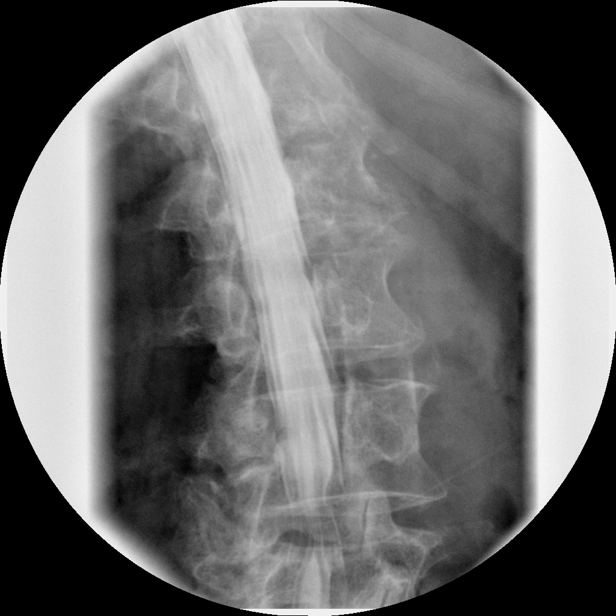

[Series 8: myelogram  white · 1 of 1 slices shown (4 of 9)]
[im 1/1]
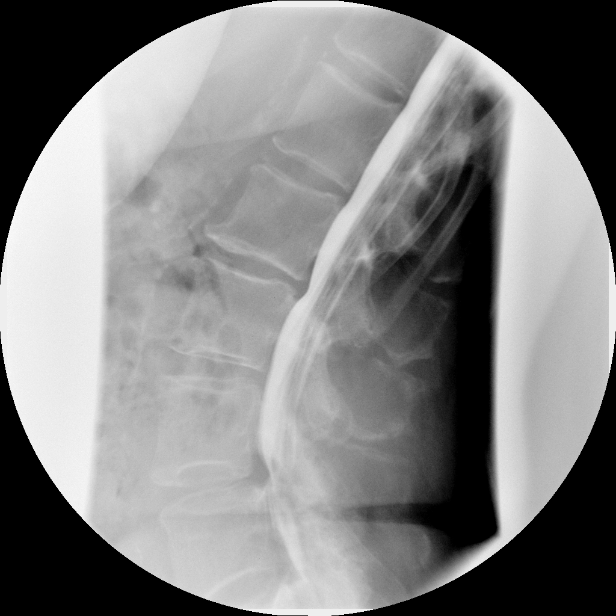

[Series 10: myelogram  white · 1 of 1 slices shown (5 of 9)]
[im 1/1]
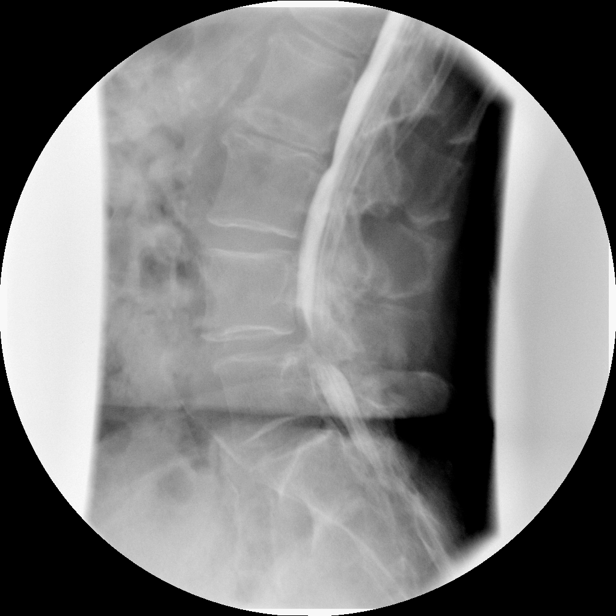

[Series 11: myelogram  white · 1 of 1 slices shown (6 of 9)]
[im 1/1]
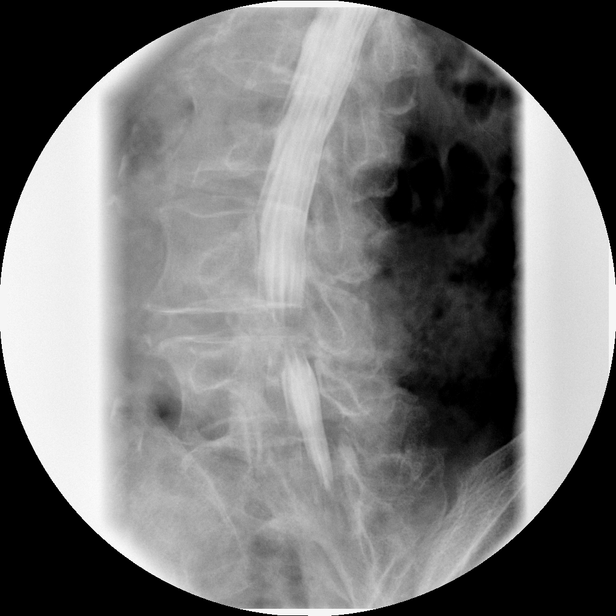

[Series 12: myelogram  white · 1 of 1 slices shown (7 of 9)]
[im 1/1]
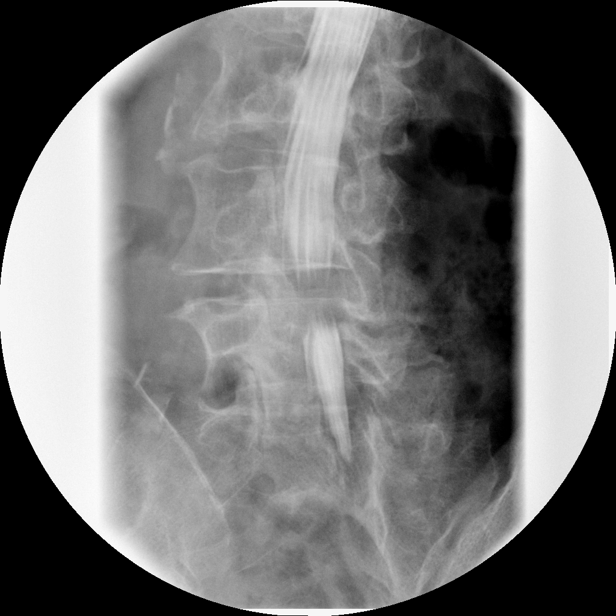

[Series 14: myelogram  white · 1 of 1 slices shown (8 of 9)]
[im 1/1]
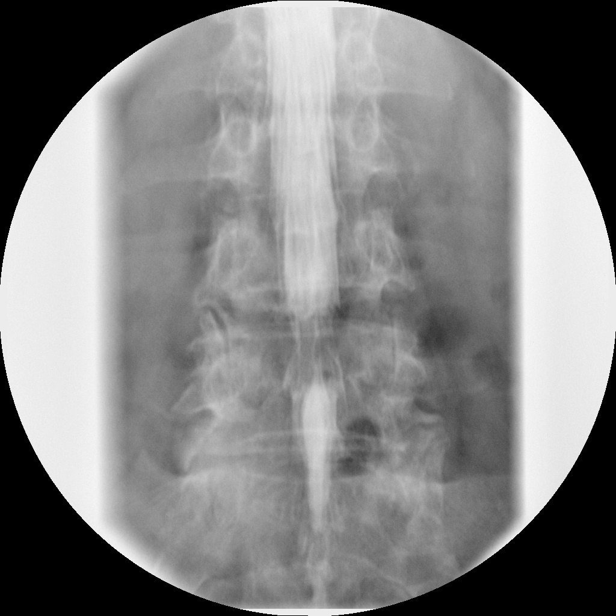

[Series 15: myelogram  white · 1 of 1 slices shown (9 of 9)]
[im 1/1]
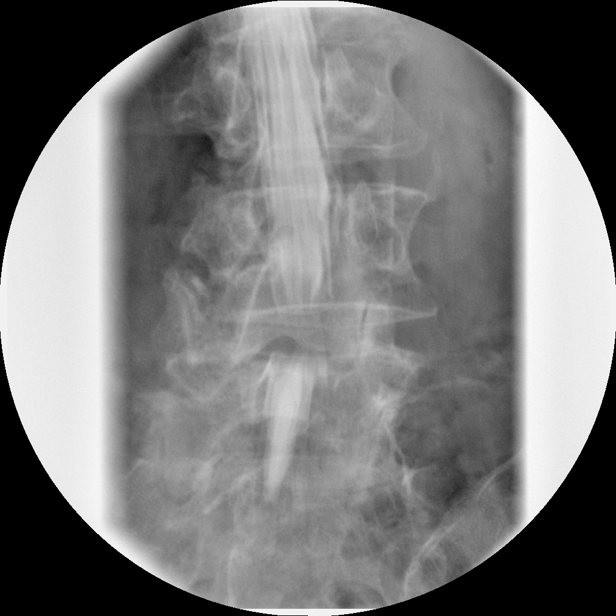

[Series 1001: view not recorded · 0.20mm/px · 1 of 1 slices shown (1 of 3)]
[im 1/1]
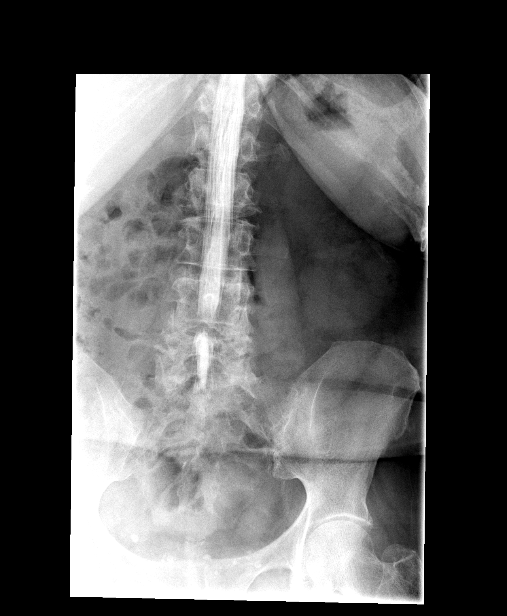

[Series 1002: view not recorded · 0.20mm/px · 1 of 1 slices shown (2 of 3)]
[im 1/1]
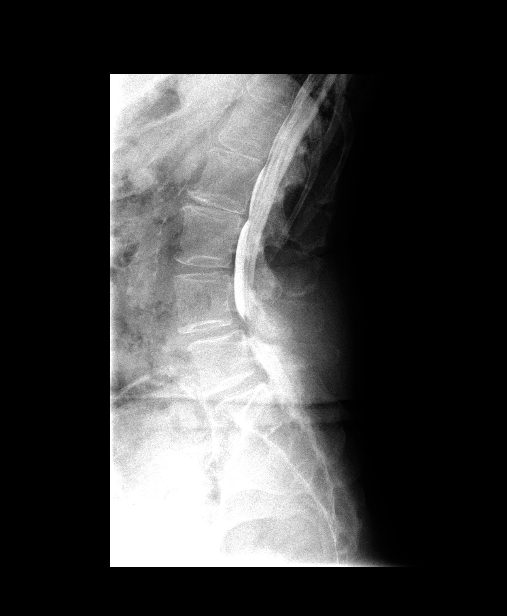

[Series 1004: view not recorded · 0.20mm/px · 1 of 1 slices shown (3 of 3)]
[im 1/1]
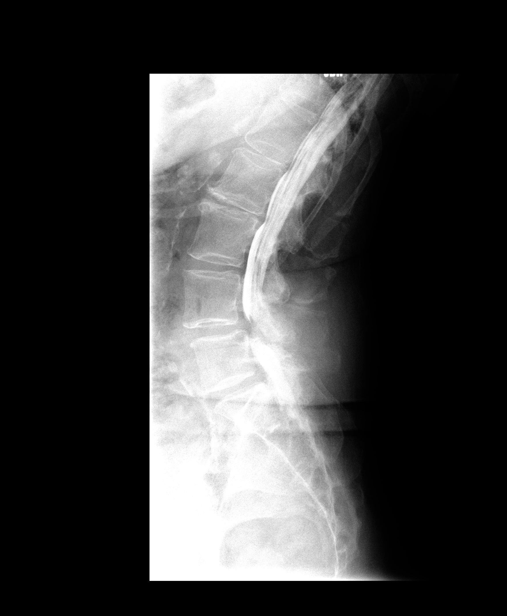

[13 of 19 positions shown; findings below may reference images not displayed]

IMPRESSION: Successful injection of  intrathecal contrast for myelography.

MYELOGRAM LUMBAR
FINDINGS: Good opacification of the lumbar subarachnoid space.
There is waist like narrowing at L4-L5 secondary to mild
anterolisthesis, as well as ventral soft tissue extradural defect
and posterior element hypertrophy.  Subtotal block is present.
Severe bilateral L5 nerve root impingement is noted.  This
impingement is worse with the patient standing.

On upright radiographs, with the patient in neutral, the
anterolisthesis at L4-L5 measures 6 mm.  In flexion, the
anterolisthesis increases to 7 mm.  In extension, the
anterolisthesis decreases to 3 mm.  Shallow ventral defects are
noted at L2-3 and L5-S1.

At L5-S1 there is mild anterolisthesis of 2 mm.  This does not
change between flexion and extension.

Fluoroscopy Time: 1 minute 49 seconds.
IMPRESSION: As above.

CT MYELOGRAPHY LUMBAR SPINE
FINDINGS: Normal conus.  No paravertebral masses.  Large left
renal cyst unchanged from recent ultrasound.

Extensive but nonaneurysmal atherosclerotic calcification of the
aortoiliac system.  No worrisome osseous lesions.

L1-2: Small central protrusion is non compressive.

L2-3: Mild bulge.  Mild facet arthropathy.  No compressive lesion.

L3-4: Mild bulge.  Mild facet arthropathy.  No compressive lesion.

L4-5: 3 mm anterolisthesis with the patient recumbent, facet
mediated.  Advanced facet and ligamentum flavum hypertrophy.
Shallow Central disc protrusion.  Bilateral L4 and L5 nerve root
impingement.

L5-S1: 2 mm anterolisthesis.  Advanced facet arthropathy.  Mild
annular bulging.  No S1 nerve root impingement in the canal.  Left
L5 nerve root impingement in the foramen.
IMPRESSION: Severe multifactorial spinal stenosis at L4-L5 related to central
disc protrusion, facet arthropathy, and ligamentum flavum
hypertrophy.  Bilateral L4 and L5 nerve root impingement.  Dynamic
instability up to 7 mm with the patient standing flexion.

Mild facet mediated anterolisthesis L5-S1 without significant
stenosis or S1 nerve root compression.  Asymmetric foraminal
narrowing on the left could potentially affect the left L5 nerve
root.

## 2013-10-10 ENCOUNTER — Other Ambulatory Visit: Payer: Self-pay

## 2013-10-10 DIAGNOSIS — Z1231 Encounter for screening mammogram for malignant neoplasm of breast: Secondary | ICD-10-CM

## 2013-10-31 ENCOUNTER — Ambulatory Visit
Admission: RE | Admit: 2013-10-31 | Discharge: 2013-10-31 | Disposition: A | Discharge status: Home / Self Care | Payer: Medicare HMO | Source: Ambulatory Visit

## 2013-10-31 DIAGNOSIS — Z1231 Encounter for screening mammogram for malignant neoplasm of breast: Secondary | ICD-10-CM

## 2014-10-03 ENCOUNTER — Other Ambulatory Visit: Payer: Self-pay

## 2014-10-03 DIAGNOSIS — Z1231 Encounter for screening mammogram for malignant neoplasm of breast: Secondary | ICD-10-CM

## 2014-11-08 ENCOUNTER — Ambulatory Visit
Admission: RE | Admit: 2014-11-08 | Discharge: 2014-11-08 | Disposition: A | Discharge status: Home / Self Care | Payer: Medicare HMO | Source: Ambulatory Visit

## 2014-11-08 DIAGNOSIS — Z1231 Encounter for screening mammogram for malignant neoplasm of breast: Secondary | ICD-10-CM

## 2015-05-05 DIAGNOSIS — I10 Essential (primary) hypertension: Secondary | ICD-10-CM | POA: Diagnosis not present

## 2015-05-05 DIAGNOSIS — K219 Gastro-esophageal reflux disease without esophagitis: Secondary | ICD-10-CM | POA: Diagnosis not present

## 2015-05-05 DIAGNOSIS — E119 Type 2 diabetes mellitus without complications: Secondary | ICD-10-CM | POA: Diagnosis not present

## 2015-05-05 DIAGNOSIS — M545 Low back pain: Secondary | ICD-10-CM | POA: Diagnosis not present

## 2015-05-05 DIAGNOSIS — E559 Vitamin D deficiency, unspecified: Secondary | ICD-10-CM | POA: Diagnosis not present

## 2015-05-05 DIAGNOSIS — G4700 Insomnia, unspecified: Secondary | ICD-10-CM | POA: Diagnosis not present

## 2015-05-05 DIAGNOSIS — Z6827 Body mass index (BMI) 27.0-27.9, adult: Secondary | ICD-10-CM | POA: Diagnosis not present

## 2015-05-05 DIAGNOSIS — M79605 Pain in left leg: Secondary | ICD-10-CM | POA: Diagnosis not present

## 2015-05-05 DIAGNOSIS — M810 Age-related osteoporosis without current pathological fracture: Secondary | ICD-10-CM | POA: Diagnosis not present

## 2015-05-05 DIAGNOSIS — E7800 Pure hypercholesterolemia, unspecified: Secondary | ICD-10-CM | POA: Diagnosis not present

## 2015-05-05 DIAGNOSIS — I2699 Other pulmonary embolism without acute cor pulmonale: Secondary | ICD-10-CM | POA: Diagnosis not present

## 2015-06-16 DIAGNOSIS — Z23 Encounter for immunization: Secondary | ICD-10-CM | POA: Diagnosis not present

## 2015-06-16 DIAGNOSIS — R42 Dizziness and giddiness: Secondary | ICD-10-CM | POA: Diagnosis not present

## 2015-06-16 DIAGNOSIS — G4700 Insomnia, unspecified: Secondary | ICD-10-CM | POA: Diagnosis not present

## 2015-06-16 DIAGNOSIS — M79605 Pain in left leg: Secondary | ICD-10-CM | POA: Diagnosis not present

## 2015-06-16 DIAGNOSIS — E7800 Pure hypercholesterolemia, unspecified: Secondary | ICD-10-CM | POA: Diagnosis not present

## 2015-06-16 DIAGNOSIS — I10 Essential (primary) hypertension: Secondary | ICD-10-CM | POA: Diagnosis not present

## 2015-06-16 DIAGNOSIS — E119 Type 2 diabetes mellitus without complications: Secondary | ICD-10-CM | POA: Diagnosis not present

## 2015-06-16 DIAGNOSIS — E559 Vitamin D deficiency, unspecified: Secondary | ICD-10-CM | POA: Diagnosis not present

## 2015-06-16 DIAGNOSIS — K219 Gastro-esophageal reflux disease without esophagitis: Secondary | ICD-10-CM | POA: Diagnosis not present

## 2015-06-16 DIAGNOSIS — M810 Age-related osteoporosis without current pathological fracture: Secondary | ICD-10-CM | POA: Diagnosis not present

## 2015-07-04 DIAGNOSIS — E785 Hyperlipidemia, unspecified: Secondary | ICD-10-CM | POA: Diagnosis not present

## 2015-07-04 DIAGNOSIS — K219 Gastro-esophageal reflux disease without esophagitis: Secondary | ICD-10-CM | POA: Diagnosis not present

## 2015-07-04 DIAGNOSIS — I10 Essential (primary) hypertension: Secondary | ICD-10-CM | POA: Diagnosis not present

## 2015-07-14 DIAGNOSIS — E559 Vitamin D deficiency, unspecified: Secondary | ICD-10-CM | POA: Diagnosis not present

## 2015-07-14 DIAGNOSIS — E7800 Pure hypercholesterolemia, unspecified: Secondary | ICD-10-CM | POA: Diagnosis not present

## 2015-07-14 DIAGNOSIS — M79605 Pain in left leg: Secondary | ICD-10-CM | POA: Diagnosis not present

## 2015-07-14 DIAGNOSIS — G4700 Insomnia, unspecified: Secondary | ICD-10-CM | POA: Diagnosis not present

## 2015-07-14 DIAGNOSIS — M810 Age-related osteoporosis without current pathological fracture: Secondary | ICD-10-CM | POA: Diagnosis not present

## 2015-07-14 DIAGNOSIS — E119 Type 2 diabetes mellitus without complications: Secondary | ICD-10-CM | POA: Diagnosis not present

## 2015-07-14 DIAGNOSIS — I10 Essential (primary) hypertension: Secondary | ICD-10-CM | POA: Diagnosis not present

## 2015-07-14 DIAGNOSIS — K219 Gastro-esophageal reflux disease without esophagitis: Secondary | ICD-10-CM | POA: Diagnosis not present

## 2015-07-14 DIAGNOSIS — I2699 Other pulmonary embolism without acute cor pulmonale: Secondary | ICD-10-CM | POA: Diagnosis not present

## 2015-07-14 DIAGNOSIS — M25519 Pain in unspecified shoulder: Secondary | ICD-10-CM | POA: Diagnosis not present

## 2015-08-18 DIAGNOSIS — M79605 Pain in left leg: Secondary | ICD-10-CM | POA: Diagnosis not present

## 2015-08-18 DIAGNOSIS — G4700 Insomnia, unspecified: Secondary | ICD-10-CM | POA: Diagnosis not present

## 2015-08-18 DIAGNOSIS — I10 Essential (primary) hypertension: Secondary | ICD-10-CM | POA: Diagnosis not present

## 2015-08-18 DIAGNOSIS — K219 Gastro-esophageal reflux disease without esophagitis: Secondary | ICD-10-CM | POA: Diagnosis not present

## 2015-08-18 DIAGNOSIS — M810 Age-related osteoporosis without current pathological fracture: Secondary | ICD-10-CM | POA: Diagnosis not present

## 2015-08-18 DIAGNOSIS — E559 Vitamin D deficiency, unspecified: Secondary | ICD-10-CM | POA: Diagnosis not present

## 2015-08-18 DIAGNOSIS — E119 Type 2 diabetes mellitus without complications: Secondary | ICD-10-CM | POA: Diagnosis not present

## 2015-08-18 DIAGNOSIS — E7800 Pure hypercholesterolemia, unspecified: Secondary | ICD-10-CM | POA: Diagnosis not present

## 2015-08-18 DIAGNOSIS — Z9181 History of falling: Secondary | ICD-10-CM | POA: Diagnosis not present

## 2015-08-18 DIAGNOSIS — E663 Overweight: Secondary | ICD-10-CM | POA: Diagnosis not present

## 2015-09-10 ENCOUNTER — Ambulatory Visit (INDEPENDENT_AMBULATORY_CARE_PROVIDER_SITE_OTHER): Payer: Medicare HMO | Admitting: Podiatry

## 2015-09-10 ENCOUNTER — Ambulatory Visit: Payer: Medicare HMO

## 2015-09-10 ENCOUNTER — Encounter: Payer: Self-pay | Admitting: Podiatry

## 2015-09-10 VITALS — BP 127/73 | HR 74 | Resp 14

## 2015-09-10 DIAGNOSIS — M2012 Hallux valgus (acquired), left foot: Secondary | ICD-10-CM

## 2015-09-10 DIAGNOSIS — R52 Pain, unspecified: Secondary | ICD-10-CM

## 2015-09-10 DIAGNOSIS — L84 Corns and callosities: Secondary | ICD-10-CM

## 2015-09-10 NOTE — Progress Notes (Deleted)
   Subjective:    Patient ID: Kristy Pope, female    DOB: 1946/07/15, 70 y.o.   MRN: [id number]  HPI    Review of Systems  Cardiovascular: Positive for leg swelling.       Objective:   Physical Exam        Assessment & Plan:

## 2015-09-10 NOTE — Addendum Note (Signed)
Addended by: Harlon Flor, Jassica Zazueta L on: 09/10/2015 03:26 PM   Modules accepted: Orders

## 2015-09-10 NOTE — Progress Notes (Addendum)
   Subjective:    Patient ID: Kristy Pope, female    DOB: 1945-09-15, 70 y.o.   MRN: [id number]  HPI this patient presents to the office with chief complaint of a painful first, second toe, left foot. She states she's developed a painful corn between these toes in the last 3-4 weeks.  She says her right heels that she wears to church have caused this problem to worsen. She has provided no self treatment nor sought any professional help. She presents the office today for an evaluation and treatment of this condition The patient presents here today with left great and 2nd toe that is painful since 3 wks ago.    Review of Systems  All other systems reviewed and are negative.      Objective:   Physical Exam GENERAL APPEARANCE: Alert, conversant. Appropriately groomed. No acute distress.  VASCULAR: Pedal pulses palpable at  [facility] and PT bilateral.  Capillary refill time is immediate to all digits,  Normal temperature gradient.  Digital hair growth is present bilateral  NEUROLOGIC: sensation is normal to 5.07 monofilament at 5/5 sites bilateral.  Light touch is intact bilateral, Muscle strength normal.  MUSCULOSKELETAL: acceptable muscle strength, tone and stability bilateral.  Intrinsic muscluature intact bilateral.  Rectus appearance of foot and digits noted bilateral. 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  DERMATOLOGIC: skin color, texture, and turgor are within normal limits.  No preulcerative lesions or ulcers  are seen, no interdigital maceration noted.  No open lesions present.  Digital nails are asymptomatic. No drainage noted. Corn 1/2 left foot.         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  IE  Debride corn  Padding was dispensed.  RTC prn  Discussed footgear.   Helane Gunther DPM

## 2015-09-15 DIAGNOSIS — E7800 Pure hypercholesterolemia, unspecified: Secondary | ICD-10-CM | POA: Diagnosis not present

## 2015-09-15 DIAGNOSIS — M79605 Pain in left leg: Secondary | ICD-10-CM | POA: Diagnosis not present

## 2015-09-15 DIAGNOSIS — E119 Type 2 diabetes mellitus without complications: Secondary | ICD-10-CM | POA: Diagnosis not present

## 2015-09-15 DIAGNOSIS — G4700 Insomnia, unspecified: Secondary | ICD-10-CM | POA: Diagnosis not present

## 2015-09-15 DIAGNOSIS — M545 Low back pain: Secondary | ICD-10-CM | POA: Diagnosis not present

## 2015-09-15 DIAGNOSIS — J209 Acute bronchitis, unspecified: Secondary | ICD-10-CM | POA: Diagnosis not present

## 2015-09-15 DIAGNOSIS — E559 Vitamin D deficiency, unspecified: Secondary | ICD-10-CM | POA: Diagnosis not present

## 2015-09-15 DIAGNOSIS — K219 Gastro-esophageal reflux disease without esophagitis: Secondary | ICD-10-CM | POA: Diagnosis not present

## 2015-09-15 DIAGNOSIS — I2699 Other pulmonary embolism without acute cor pulmonale: Secondary | ICD-10-CM | POA: Diagnosis not present

## 2015-09-15 DIAGNOSIS — I10 Essential (primary) hypertension: Secondary | ICD-10-CM | POA: Diagnosis not present

## 2015-10-13 DIAGNOSIS — M79605 Pain in left leg: Secondary | ICD-10-CM | POA: Diagnosis not present

## 2015-10-13 DIAGNOSIS — I2699 Other pulmonary embolism without acute cor pulmonale: Secondary | ICD-10-CM | POA: Diagnosis not present

## 2015-10-13 DIAGNOSIS — E7800 Pure hypercholesterolemia, unspecified: Secondary | ICD-10-CM | POA: Diagnosis not present

## 2015-10-13 DIAGNOSIS — K219 Gastro-esophageal reflux disease without esophagitis: Secondary | ICD-10-CM | POA: Diagnosis not present

## 2015-10-13 DIAGNOSIS — M810 Age-related osteoporosis without current pathological fracture: Secondary | ICD-10-CM | POA: Diagnosis not present

## 2015-10-13 DIAGNOSIS — M545 Low back pain: Secondary | ICD-10-CM | POA: Diagnosis not present

## 2015-10-13 DIAGNOSIS — G4700 Insomnia, unspecified: Secondary | ICD-10-CM | POA: Diagnosis not present

## 2015-10-13 DIAGNOSIS — I10 Essential (primary) hypertension: Secondary | ICD-10-CM | POA: Diagnosis not present

## 2015-10-13 DIAGNOSIS — E119 Type 2 diabetes mellitus without complications: Secondary | ICD-10-CM | POA: Diagnosis not present

## 2015-10-13 DIAGNOSIS — E559 Vitamin D deficiency, unspecified: Secondary | ICD-10-CM | POA: Diagnosis not present

## 2015-10-29 ENCOUNTER — Ambulatory Visit: Payer: Medicare HMO | Admitting: Podiatry

## 2015-11-07 DIAGNOSIS — Z1231 Encounter for screening mammogram for malignant neoplasm of breast: Secondary | ICD-10-CM | POA: Diagnosis not present

## 2015-11-10 DIAGNOSIS — E119 Type 2 diabetes mellitus without complications: Secondary | ICD-10-CM | POA: Diagnosis not present

## 2015-11-10 DIAGNOSIS — M545 Low back pain: Secondary | ICD-10-CM | POA: Diagnosis not present

## 2015-11-10 DIAGNOSIS — E7800 Pure hypercholesterolemia, unspecified: Secondary | ICD-10-CM | POA: Diagnosis not present

## 2015-11-10 DIAGNOSIS — M810 Age-related osteoporosis without current pathological fracture: Secondary | ICD-10-CM | POA: Diagnosis not present

## 2015-11-10 DIAGNOSIS — E559 Vitamin D deficiency, unspecified: Secondary | ICD-10-CM | POA: Diagnosis not present

## 2015-11-10 DIAGNOSIS — I2699 Other pulmonary embolism without acute cor pulmonale: Secondary | ICD-10-CM | POA: Diagnosis not present

## 2015-11-10 DIAGNOSIS — K219 Gastro-esophageal reflux disease without esophagitis: Secondary | ICD-10-CM | POA: Diagnosis not present

## 2015-11-10 DIAGNOSIS — G4700 Insomnia, unspecified: Secondary | ICD-10-CM | POA: Diagnosis not present

## 2015-11-10 DIAGNOSIS — I10 Essential (primary) hypertension: Secondary | ICD-10-CM | POA: Diagnosis not present

## 2015-11-10 DIAGNOSIS — M79605 Pain in left leg: Secondary | ICD-10-CM | POA: Diagnosis not present

## 2015-12-08 DIAGNOSIS — E119 Type 2 diabetes mellitus without complications: Secondary | ICD-10-CM | POA: Diagnosis not present

## 2015-12-08 DIAGNOSIS — K219 Gastro-esophageal reflux disease without esophagitis: Secondary | ICD-10-CM | POA: Diagnosis not present

## 2015-12-08 DIAGNOSIS — E7800 Pure hypercholesterolemia, unspecified: Secondary | ICD-10-CM | POA: Diagnosis not present

## 2015-12-08 DIAGNOSIS — E559 Vitamin D deficiency, unspecified: Secondary | ICD-10-CM | POA: Diagnosis not present

## 2015-12-08 DIAGNOSIS — I10 Essential (primary) hypertension: Secondary | ICD-10-CM | POA: Diagnosis not present

## 2015-12-08 DIAGNOSIS — M810 Age-related osteoporosis without current pathological fracture: Secondary | ICD-10-CM | POA: Diagnosis not present

## 2015-12-08 DIAGNOSIS — I2699 Other pulmonary embolism without acute cor pulmonale: Secondary | ICD-10-CM | POA: Diagnosis not present

## 2015-12-08 DIAGNOSIS — M545 Low back pain: Secondary | ICD-10-CM | POA: Diagnosis not present

## 2015-12-08 DIAGNOSIS — G4700 Insomnia, unspecified: Secondary | ICD-10-CM | POA: Diagnosis not present

## 2015-12-08 DIAGNOSIS — M79605 Pain in left leg: Secondary | ICD-10-CM | POA: Diagnosis not present

## 2015-12-22 DIAGNOSIS — M79604 Pain in right leg: Secondary | ICD-10-CM | POA: Diagnosis not present

## 2016-01-05 DIAGNOSIS — E559 Vitamin D deficiency, unspecified: Secondary | ICD-10-CM | POA: Diagnosis not present

## 2016-01-05 DIAGNOSIS — E7800 Pure hypercholesterolemia, unspecified: Secondary | ICD-10-CM | POA: Diagnosis not present

## 2016-01-05 DIAGNOSIS — I10 Essential (primary) hypertension: Secondary | ICD-10-CM | POA: Diagnosis not present

## 2016-01-05 DIAGNOSIS — K219 Gastro-esophageal reflux disease without esophagitis: Secondary | ICD-10-CM | POA: Diagnosis not present

## 2016-01-05 DIAGNOSIS — M810 Age-related osteoporosis without current pathological fracture: Secondary | ICD-10-CM | POA: Diagnosis not present

## 2016-01-05 DIAGNOSIS — M545 Low back pain: Secondary | ICD-10-CM | POA: Diagnosis not present

## 2016-01-05 DIAGNOSIS — E119 Type 2 diabetes mellitus without complications: Secondary | ICD-10-CM | POA: Diagnosis not present

## 2016-01-05 DIAGNOSIS — M79605 Pain in left leg: Secondary | ICD-10-CM | POA: Diagnosis not present

## 2016-01-05 DIAGNOSIS — I2699 Other pulmonary embolism without acute cor pulmonale: Secondary | ICD-10-CM | POA: Diagnosis not present

## 2016-01-05 DIAGNOSIS — G4700 Insomnia, unspecified: Secondary | ICD-10-CM | POA: Diagnosis not present

## 2016-01-16 DIAGNOSIS — R69 Illness, unspecified: Secondary | ICD-10-CM | POA: Diagnosis not present

## 2016-01-16 DIAGNOSIS — K219 Gastro-esophageal reflux disease without esophagitis: Secondary | ICD-10-CM | POA: Diagnosis not present

## 2016-01-16 DIAGNOSIS — I10 Essential (primary) hypertension: Secondary | ICD-10-CM | POA: Diagnosis not present

## 2016-02-02 DIAGNOSIS — I2699 Other pulmonary embolism without acute cor pulmonale: Secondary | ICD-10-CM | POA: Diagnosis not present

## 2016-02-02 DIAGNOSIS — G4700 Insomnia, unspecified: Secondary | ICD-10-CM | POA: Diagnosis not present

## 2016-02-02 DIAGNOSIS — M810 Age-related osteoporosis without current pathological fracture: Secondary | ICD-10-CM | POA: Diagnosis not present

## 2016-02-02 DIAGNOSIS — K219 Gastro-esophageal reflux disease without esophagitis: Secondary | ICD-10-CM | POA: Diagnosis not present

## 2016-02-02 DIAGNOSIS — E119 Type 2 diabetes mellitus without complications: Secondary | ICD-10-CM | POA: Diagnosis not present

## 2016-02-02 DIAGNOSIS — N189 Chronic kidney disease, unspecified: Secondary | ICD-10-CM | POA: Diagnosis not present

## 2016-02-02 DIAGNOSIS — E7800 Pure hypercholesterolemia, unspecified: Secondary | ICD-10-CM | POA: Diagnosis not present

## 2016-02-02 DIAGNOSIS — I10 Essential (primary) hypertension: Secondary | ICD-10-CM | POA: Diagnosis not present

## 2016-02-02 DIAGNOSIS — M79605 Pain in left leg: Secondary | ICD-10-CM | POA: Diagnosis not present

## 2016-02-02 DIAGNOSIS — E559 Vitamin D deficiency, unspecified: Secondary | ICD-10-CM | POA: Diagnosis not present

## 2016-02-13 DIAGNOSIS — Z0100 Encounter for examination of eyes and vision without abnormal findings: Secondary | ICD-10-CM | POA: Diagnosis not present

## 2016-02-13 DIAGNOSIS — H524 Presbyopia: Secondary | ICD-10-CM | POA: Diagnosis not present

## 2016-03-01 DIAGNOSIS — M79605 Pain in left leg: Secondary | ICD-10-CM | POA: Diagnosis not present

## 2016-03-01 DIAGNOSIS — E119 Type 2 diabetes mellitus without complications: Secondary | ICD-10-CM | POA: Diagnosis not present

## 2016-03-01 DIAGNOSIS — K219 Gastro-esophageal reflux disease without esophagitis: Secondary | ICD-10-CM | POA: Diagnosis not present

## 2016-03-01 DIAGNOSIS — E559 Vitamin D deficiency, unspecified: Secondary | ICD-10-CM | POA: Diagnosis not present

## 2016-03-01 DIAGNOSIS — M545 Low back pain: Secondary | ICD-10-CM | POA: Diagnosis not present

## 2016-03-01 DIAGNOSIS — I10 Essential (primary) hypertension: Secondary | ICD-10-CM | POA: Diagnosis not present

## 2016-03-01 DIAGNOSIS — E7800 Pure hypercholesterolemia, unspecified: Secondary | ICD-10-CM | POA: Diagnosis not present

## 2016-03-01 DIAGNOSIS — M810 Age-related osteoporosis without current pathological fracture: Secondary | ICD-10-CM | POA: Diagnosis not present

## 2016-03-01 DIAGNOSIS — I2699 Other pulmonary embolism without acute cor pulmonale: Secondary | ICD-10-CM | POA: Diagnosis not present

## 2016-03-01 DIAGNOSIS — G4700 Insomnia, unspecified: Secondary | ICD-10-CM | POA: Diagnosis not present

## 2016-03-29 DIAGNOSIS — M545 Low back pain: Secondary | ICD-10-CM | POA: Diagnosis not present

## 2016-03-29 DIAGNOSIS — M79605 Pain in left leg: Secondary | ICD-10-CM | POA: Diagnosis not present

## 2016-03-29 DIAGNOSIS — E119 Type 2 diabetes mellitus without complications: Secondary | ICD-10-CM | POA: Diagnosis not present

## 2016-03-29 DIAGNOSIS — I10 Essential (primary) hypertension: Secondary | ICD-10-CM | POA: Diagnosis not present

## 2016-03-29 DIAGNOSIS — M810 Age-related osteoporosis without current pathological fracture: Secondary | ICD-10-CM | POA: Diagnosis not present

## 2016-03-29 DIAGNOSIS — E7800 Pure hypercholesterolemia, unspecified: Secondary | ICD-10-CM | POA: Diagnosis not present

## 2016-03-29 DIAGNOSIS — I2699 Other pulmonary embolism without acute cor pulmonale: Secondary | ICD-10-CM | POA: Diagnosis not present

## 2016-03-29 DIAGNOSIS — G4700 Insomnia, unspecified: Secondary | ICD-10-CM | POA: Diagnosis not present

## 2016-03-29 DIAGNOSIS — E559 Vitamin D deficiency, unspecified: Secondary | ICD-10-CM | POA: Diagnosis not present

## 2016-03-29 DIAGNOSIS — K219 Gastro-esophageal reflux disease without esophagitis: Secondary | ICD-10-CM | POA: Diagnosis not present

## 2016-05-03 DIAGNOSIS — G4700 Insomnia, unspecified: Secondary | ICD-10-CM | POA: Diagnosis not present

## 2016-05-03 DIAGNOSIS — E7800 Pure hypercholesterolemia, unspecified: Secondary | ICD-10-CM | POA: Diagnosis not present

## 2016-05-03 DIAGNOSIS — K219 Gastro-esophageal reflux disease without esophagitis: Secondary | ICD-10-CM | POA: Diagnosis not present

## 2016-05-03 DIAGNOSIS — M79605 Pain in left leg: Secondary | ICD-10-CM | POA: Diagnosis not present

## 2016-05-03 DIAGNOSIS — M25511 Pain in right shoulder: Secondary | ICD-10-CM | POA: Diagnosis not present

## 2016-05-03 DIAGNOSIS — I10 Essential (primary) hypertension: Secondary | ICD-10-CM | POA: Diagnosis not present

## 2016-05-03 DIAGNOSIS — I2699 Other pulmonary embolism without acute cor pulmonale: Secondary | ICD-10-CM | POA: Diagnosis not present

## 2016-05-03 DIAGNOSIS — E559 Vitamin D deficiency, unspecified: Secondary | ICD-10-CM | POA: Diagnosis not present

## 2016-05-03 DIAGNOSIS — M810 Age-related osteoporosis without current pathological fracture: Secondary | ICD-10-CM | POA: Diagnosis not present

## 2016-05-03 DIAGNOSIS — E119 Type 2 diabetes mellitus without complications: Secondary | ICD-10-CM | POA: Diagnosis not present

## 2016-05-31 DIAGNOSIS — E559 Vitamin D deficiency, unspecified: Secondary | ICD-10-CM | POA: Diagnosis not present

## 2016-05-31 DIAGNOSIS — M79605 Pain in left leg: Secondary | ICD-10-CM | POA: Diagnosis not present

## 2016-05-31 DIAGNOSIS — I10 Essential (primary) hypertension: Secondary | ICD-10-CM | POA: Diagnosis not present

## 2016-05-31 DIAGNOSIS — I2699 Other pulmonary embolism without acute cor pulmonale: Secondary | ICD-10-CM | POA: Diagnosis not present

## 2016-05-31 DIAGNOSIS — Z23 Encounter for immunization: Secondary | ICD-10-CM | POA: Diagnosis not present

## 2016-05-31 DIAGNOSIS — K219 Gastro-esophageal reflux disease without esophagitis: Secondary | ICD-10-CM | POA: Diagnosis not present

## 2016-05-31 DIAGNOSIS — M810 Age-related osteoporosis without current pathological fracture: Secondary | ICD-10-CM | POA: Diagnosis not present

## 2016-05-31 DIAGNOSIS — E7800 Pure hypercholesterolemia, unspecified: Secondary | ICD-10-CM | POA: Diagnosis not present

## 2016-05-31 DIAGNOSIS — G4700 Insomnia, unspecified: Secondary | ICD-10-CM | POA: Diagnosis not present

## 2016-05-31 DIAGNOSIS — E119 Type 2 diabetes mellitus without complications: Secondary | ICD-10-CM | POA: Diagnosis not present

## 2016-07-05 DIAGNOSIS — E119 Type 2 diabetes mellitus without complications: Secondary | ICD-10-CM | POA: Diagnosis not present

## 2016-07-05 DIAGNOSIS — M79605 Pain in left leg: Secondary | ICD-10-CM | POA: Diagnosis not present

## 2016-07-05 DIAGNOSIS — M545 Low back pain: Secondary | ICD-10-CM | POA: Diagnosis not present

## 2016-07-05 DIAGNOSIS — G4700 Insomnia, unspecified: Secondary | ICD-10-CM | POA: Diagnosis not present

## 2016-07-05 DIAGNOSIS — M810 Age-related osteoporosis without current pathological fracture: Secondary | ICD-10-CM | POA: Diagnosis not present

## 2016-07-05 DIAGNOSIS — I2699 Other pulmonary embolism without acute cor pulmonale: Secondary | ICD-10-CM | POA: Diagnosis not present

## 2016-07-05 DIAGNOSIS — E7800 Pure hypercholesterolemia, unspecified: Secondary | ICD-10-CM | POA: Diagnosis not present

## 2016-07-05 DIAGNOSIS — E559 Vitamin D deficiency, unspecified: Secondary | ICD-10-CM | POA: Diagnosis not present

## 2016-07-05 DIAGNOSIS — K219 Gastro-esophageal reflux disease without esophagitis: Secondary | ICD-10-CM | POA: Diagnosis not present

## 2016-07-05 DIAGNOSIS — I10 Essential (primary) hypertension: Secondary | ICD-10-CM | POA: Diagnosis not present

## 2016-08-07 DIAGNOSIS — M810 Age-related osteoporosis without current pathological fracture: Secondary | ICD-10-CM | POA: Diagnosis not present

## 2016-08-07 DIAGNOSIS — M79605 Pain in left leg: Secondary | ICD-10-CM | POA: Diagnosis not present

## 2016-08-07 DIAGNOSIS — G4700 Insomnia, unspecified: Secondary | ICD-10-CM | POA: Diagnosis not present

## 2016-08-07 DIAGNOSIS — K219 Gastro-esophageal reflux disease without esophagitis: Secondary | ICD-10-CM | POA: Diagnosis not present

## 2016-08-07 DIAGNOSIS — I2699 Other pulmonary embolism without acute cor pulmonale: Secondary | ICD-10-CM | POA: Diagnosis not present

## 2016-08-07 DIAGNOSIS — I10 Essential (primary) hypertension: Secondary | ICD-10-CM | POA: Diagnosis not present

## 2016-08-07 DIAGNOSIS — M545 Low back pain: Secondary | ICD-10-CM | POA: Diagnosis not present

## 2016-08-07 DIAGNOSIS — E7800 Pure hypercholesterolemia, unspecified: Secondary | ICD-10-CM | POA: Diagnosis not present

## 2016-08-07 DIAGNOSIS — E559 Vitamin D deficiency, unspecified: Secondary | ICD-10-CM | POA: Diagnosis not present

## 2016-08-07 DIAGNOSIS — E119 Type 2 diabetes mellitus without complications: Secondary | ICD-10-CM | POA: Diagnosis not present

## 2016-08-16 DIAGNOSIS — R079 Chest pain, unspecified: Secondary | ICD-10-CM | POA: Diagnosis not present

## 2016-08-16 DIAGNOSIS — R112 Nausea with vomiting, unspecified: Secondary | ICD-10-CM | POA: Diagnosis not present

## 2016-08-16 DIAGNOSIS — R12 Heartburn: Secondary | ICD-10-CM | POA: Diagnosis not present

## 2016-08-30 DIAGNOSIS — M79605 Pain in left leg: Secondary | ICD-10-CM | POA: Diagnosis not present

## 2016-08-30 DIAGNOSIS — E559 Vitamin D deficiency, unspecified: Secondary | ICD-10-CM | POA: Diagnosis not present

## 2016-08-30 DIAGNOSIS — E119 Type 2 diabetes mellitus without complications: Secondary | ICD-10-CM | POA: Diagnosis not present

## 2016-08-30 DIAGNOSIS — I10 Essential (primary) hypertension: Secondary | ICD-10-CM | POA: Diagnosis not present

## 2016-08-30 DIAGNOSIS — E7800 Pure hypercholesterolemia, unspecified: Secondary | ICD-10-CM | POA: Diagnosis not present

## 2016-08-30 DIAGNOSIS — K219 Gastro-esophageal reflux disease without esophagitis: Secondary | ICD-10-CM | POA: Diagnosis not present

## 2016-08-30 DIAGNOSIS — J209 Acute bronchitis, unspecified: Secondary | ICD-10-CM | POA: Diagnosis not present

## 2016-08-30 DIAGNOSIS — I2699 Other pulmonary embolism without acute cor pulmonale: Secondary | ICD-10-CM | POA: Diagnosis not present

## 2016-08-30 DIAGNOSIS — G4700 Insomnia, unspecified: Secondary | ICD-10-CM | POA: Diagnosis not present

## 2016-08-30 DIAGNOSIS — M810 Age-related osteoporosis without current pathological fracture: Secondary | ICD-10-CM | POA: Diagnosis not present

## 2016-09-04 DIAGNOSIS — G4700 Insomnia, unspecified: Secondary | ICD-10-CM | POA: Diagnosis not present

## 2016-09-04 DIAGNOSIS — M79605 Pain in left leg: Secondary | ICD-10-CM | POA: Diagnosis not present

## 2016-09-04 DIAGNOSIS — E7800 Pure hypercholesterolemia, unspecified: Secondary | ICD-10-CM | POA: Diagnosis not present

## 2016-09-04 DIAGNOSIS — I10 Essential (primary) hypertension: Secondary | ICD-10-CM | POA: Diagnosis not present

## 2016-09-04 DIAGNOSIS — I2699 Other pulmonary embolism without acute cor pulmonale: Secondary | ICD-10-CM | POA: Diagnosis not present

## 2016-09-04 DIAGNOSIS — M810 Age-related osteoporosis without current pathological fracture: Secondary | ICD-10-CM | POA: Diagnosis not present

## 2016-09-04 DIAGNOSIS — E559 Vitamin D deficiency, unspecified: Secondary | ICD-10-CM | POA: Diagnosis not present

## 2016-09-04 DIAGNOSIS — E119 Type 2 diabetes mellitus without complications: Secondary | ICD-10-CM | POA: Diagnosis not present

## 2016-09-04 DIAGNOSIS — K219 Gastro-esophageal reflux disease without esophagitis: Secondary | ICD-10-CM | POA: Diagnosis not present

## 2016-09-04 DIAGNOSIS — M25561 Pain in right knee: Secondary | ICD-10-CM | POA: Diagnosis not present

## 2016-09-06 DIAGNOSIS — M79605 Pain in left leg: Secondary | ICD-10-CM | POA: Diagnosis not present

## 2016-09-06 DIAGNOSIS — M79604 Pain in right leg: Secondary | ICD-10-CM | POA: Diagnosis not present

## 2016-09-06 DIAGNOSIS — M25511 Pain in right shoulder: Secondary | ICD-10-CM | POA: Diagnosis not present

## 2016-09-06 DIAGNOSIS — G8929 Other chronic pain: Secondary | ICD-10-CM | POA: Diagnosis not present

## 2016-09-06 DIAGNOSIS — M48061 Spinal stenosis, lumbar region without neurogenic claudication: Secondary | ICD-10-CM | POA: Diagnosis not present

## 2016-09-11 DIAGNOSIS — K219 Gastro-esophageal reflux disease without esophagitis: Secondary | ICD-10-CM | POA: Diagnosis not present

## 2016-09-11 DIAGNOSIS — M79605 Pain in left leg: Secondary | ICD-10-CM | POA: Diagnosis not present

## 2016-09-11 DIAGNOSIS — I10 Essential (primary) hypertension: Secondary | ICD-10-CM | POA: Diagnosis not present

## 2016-09-11 DIAGNOSIS — E119 Type 2 diabetes mellitus without complications: Secondary | ICD-10-CM | POA: Diagnosis not present

## 2016-09-11 DIAGNOSIS — E559 Vitamin D deficiency, unspecified: Secondary | ICD-10-CM | POA: Diagnosis not present

## 2016-09-11 DIAGNOSIS — M25562 Pain in left knee: Secondary | ICD-10-CM | POA: Diagnosis not present

## 2016-09-11 DIAGNOSIS — I2699 Other pulmonary embolism without acute cor pulmonale: Secondary | ICD-10-CM | POA: Diagnosis not present

## 2016-09-11 DIAGNOSIS — M810 Age-related osteoporosis without current pathological fracture: Secondary | ICD-10-CM | POA: Diagnosis not present

## 2016-09-11 DIAGNOSIS — E7800 Pure hypercholesterolemia, unspecified: Secondary | ICD-10-CM | POA: Diagnosis not present

## 2016-09-11 DIAGNOSIS — G4700 Insomnia, unspecified: Secondary | ICD-10-CM | POA: Diagnosis not present

## 2016-09-13 DIAGNOSIS — G8929 Other chronic pain: Secondary | ICD-10-CM | POA: Diagnosis not present

## 2016-09-13 DIAGNOSIS — M25511 Pain in right shoulder: Secondary | ICD-10-CM | POA: Diagnosis not present

## 2016-09-22 DIAGNOSIS — G8929 Other chronic pain: Secondary | ICD-10-CM | POA: Diagnosis not present

## 2016-09-22 DIAGNOSIS — M25511 Pain in right shoulder: Secondary | ICD-10-CM | POA: Diagnosis not present

## 2016-10-09 DIAGNOSIS — M79605 Pain in left leg: Secondary | ICD-10-CM | POA: Diagnosis not present

## 2016-10-09 DIAGNOSIS — I10 Essential (primary) hypertension: Secondary | ICD-10-CM | POA: Diagnosis not present

## 2016-10-09 DIAGNOSIS — K219 Gastro-esophageal reflux disease without esophagitis: Secondary | ICD-10-CM | POA: Diagnosis not present

## 2016-10-09 DIAGNOSIS — E119 Type 2 diabetes mellitus without complications: Secondary | ICD-10-CM | POA: Diagnosis not present

## 2016-10-09 DIAGNOSIS — G4700 Insomnia, unspecified: Secondary | ICD-10-CM | POA: Diagnosis not present

## 2016-10-09 DIAGNOSIS — N189 Chronic kidney disease, unspecified: Secondary | ICD-10-CM | POA: Diagnosis not present

## 2016-10-09 DIAGNOSIS — E559 Vitamin D deficiency, unspecified: Secondary | ICD-10-CM | POA: Diagnosis not present

## 2016-10-09 DIAGNOSIS — E7800 Pure hypercholesterolemia, unspecified: Secondary | ICD-10-CM | POA: Diagnosis not present

## 2016-10-09 DIAGNOSIS — M810 Age-related osteoporosis without current pathological fracture: Secondary | ICD-10-CM | POA: Diagnosis not present

## 2016-10-09 DIAGNOSIS — I2699 Other pulmonary embolism without acute cor pulmonale: Secondary | ICD-10-CM | POA: Diagnosis not present

## 2016-11-06 DIAGNOSIS — E119 Type 2 diabetes mellitus without complications: Secondary | ICD-10-CM | POA: Diagnosis not present

## 2016-11-06 DIAGNOSIS — E7800 Pure hypercholesterolemia, unspecified: Secondary | ICD-10-CM | POA: Diagnosis not present

## 2016-11-06 DIAGNOSIS — M25561 Pain in right knee: Secondary | ICD-10-CM | POA: Diagnosis not present

## 2016-11-06 DIAGNOSIS — G4700 Insomnia, unspecified: Secondary | ICD-10-CM | POA: Diagnosis not present

## 2016-11-06 DIAGNOSIS — I2699 Other pulmonary embolism without acute cor pulmonale: Secondary | ICD-10-CM | POA: Diagnosis not present

## 2016-11-06 DIAGNOSIS — M79605 Pain in left leg: Secondary | ICD-10-CM | POA: Diagnosis not present

## 2016-11-06 DIAGNOSIS — K219 Gastro-esophageal reflux disease without esophagitis: Secondary | ICD-10-CM | POA: Diagnosis not present

## 2016-11-06 DIAGNOSIS — I10 Essential (primary) hypertension: Secondary | ICD-10-CM | POA: Diagnosis not present

## 2016-11-06 DIAGNOSIS — E559 Vitamin D deficiency, unspecified: Secondary | ICD-10-CM | POA: Diagnosis not present

## 2016-11-06 DIAGNOSIS — M810 Age-related osteoporosis without current pathological fracture: Secondary | ICD-10-CM | POA: Diagnosis not present

## 2016-11-10 DIAGNOSIS — Z1231 Encounter for screening mammogram for malignant neoplasm of breast: Secondary | ICD-10-CM | POA: Diagnosis not present

## 2016-11-25 DIAGNOSIS — E119 Type 2 diabetes mellitus without complications: Secondary | ICD-10-CM | POA: Diagnosis not present

## 2016-11-25 DIAGNOSIS — I10 Essential (primary) hypertension: Secondary | ICD-10-CM | POA: Diagnosis not present

## 2016-11-25 DIAGNOSIS — E559 Vitamin D deficiency, unspecified: Secondary | ICD-10-CM | POA: Diagnosis not present

## 2016-11-25 DIAGNOSIS — I2699 Other pulmonary embolism without acute cor pulmonale: Secondary | ICD-10-CM | POA: Diagnosis not present

## 2016-11-25 DIAGNOSIS — J208 Acute bronchitis due to other specified organisms: Secondary | ICD-10-CM | POA: Diagnosis not present

## 2016-11-25 DIAGNOSIS — M810 Age-related osteoporosis without current pathological fracture: Secondary | ICD-10-CM | POA: Diagnosis not present

## 2016-11-25 DIAGNOSIS — E7800 Pure hypercholesterolemia, unspecified: Secondary | ICD-10-CM | POA: Diagnosis not present

## 2016-11-25 DIAGNOSIS — M79605 Pain in left leg: Secondary | ICD-10-CM | POA: Diagnosis not present

## 2016-11-25 DIAGNOSIS — K219 Gastro-esophageal reflux disease without esophagitis: Secondary | ICD-10-CM | POA: Diagnosis not present

## 2016-11-25 DIAGNOSIS — G4700 Insomnia, unspecified: Secondary | ICD-10-CM | POA: Diagnosis not present

## 2016-12-04 DIAGNOSIS — E559 Vitamin D deficiency, unspecified: Secondary | ICD-10-CM | POA: Diagnosis not present

## 2016-12-04 DIAGNOSIS — E119 Type 2 diabetes mellitus without complications: Secondary | ICD-10-CM | POA: Diagnosis not present

## 2016-12-04 DIAGNOSIS — E7800 Pure hypercholesterolemia, unspecified: Secondary | ICD-10-CM | POA: Diagnosis not present

## 2016-12-04 DIAGNOSIS — M79605 Pain in left leg: Secondary | ICD-10-CM | POA: Diagnosis not present

## 2016-12-04 DIAGNOSIS — I2699 Other pulmonary embolism without acute cor pulmonale: Secondary | ICD-10-CM | POA: Diagnosis not present

## 2016-12-04 DIAGNOSIS — I10 Essential (primary) hypertension: Secondary | ICD-10-CM | POA: Diagnosis not present

## 2016-12-04 DIAGNOSIS — M810 Age-related osteoporosis without current pathological fracture: Secondary | ICD-10-CM | POA: Diagnosis not present

## 2016-12-04 DIAGNOSIS — Z9181 History of falling: Secondary | ICD-10-CM | POA: Diagnosis not present

## 2016-12-04 DIAGNOSIS — G4700 Insomnia, unspecified: Secondary | ICD-10-CM | POA: Diagnosis not present

## 2016-12-04 DIAGNOSIS — K219 Gastro-esophageal reflux disease without esophagitis: Secondary | ICD-10-CM | POA: Diagnosis not present

## 2016-12-31 DIAGNOSIS — M79605 Pain in left leg: Secondary | ICD-10-CM | POA: Diagnosis not present

## 2016-12-31 DIAGNOSIS — M810 Age-related osteoporosis without current pathological fracture: Secondary | ICD-10-CM | POA: Diagnosis not present

## 2016-12-31 DIAGNOSIS — G4700 Insomnia, unspecified: Secondary | ICD-10-CM | POA: Diagnosis not present

## 2016-12-31 DIAGNOSIS — E559 Vitamin D deficiency, unspecified: Secondary | ICD-10-CM | POA: Diagnosis not present

## 2016-12-31 DIAGNOSIS — I10 Essential (primary) hypertension: Secondary | ICD-10-CM | POA: Diagnosis not present

## 2016-12-31 DIAGNOSIS — N189 Chronic kidney disease, unspecified: Secondary | ICD-10-CM | POA: Diagnosis not present

## 2016-12-31 DIAGNOSIS — E7800 Pure hypercholesterolemia, unspecified: Secondary | ICD-10-CM | POA: Diagnosis not present

## 2016-12-31 DIAGNOSIS — I2699 Other pulmonary embolism without acute cor pulmonale: Secondary | ICD-10-CM | POA: Diagnosis not present

## 2016-12-31 DIAGNOSIS — K219 Gastro-esophageal reflux disease without esophagitis: Secondary | ICD-10-CM | POA: Diagnosis not present

## 2016-12-31 DIAGNOSIS — E119 Type 2 diabetes mellitus without complications: Secondary | ICD-10-CM | POA: Diagnosis not present

## 2017-01-03 DIAGNOSIS — K219 Gastro-esophageal reflux disease without esophagitis: Secondary | ICD-10-CM | POA: Diagnosis not present

## 2017-01-03 DIAGNOSIS — M545 Low back pain: Secondary | ICD-10-CM | POA: Diagnosis not present

## 2017-01-03 DIAGNOSIS — Z7982 Long term (current) use of aspirin: Secondary | ICD-10-CM | POA: Diagnosis not present

## 2017-01-03 DIAGNOSIS — D0330 Melanoma in situ of unspecified part of face: Secondary | ICD-10-CM | POA: Diagnosis not present

## 2017-01-03 DIAGNOSIS — Z0000 Encounter for general adult medical examination without abnormal findings: Secondary | ICD-10-CM | POA: Diagnosis not present

## 2017-01-03 DIAGNOSIS — I10 Essential (primary) hypertension: Secondary | ICD-10-CM | POA: Diagnosis not present

## 2017-01-03 DIAGNOSIS — G4700 Insomnia, unspecified: Secondary | ICD-10-CM | POA: Diagnosis not present

## 2017-01-03 DIAGNOSIS — Z6825 Body mass index (BMI) 25.0-25.9, adult: Secondary | ICD-10-CM | POA: Diagnosis not present

## 2017-01-03 DIAGNOSIS — E785 Hyperlipidemia, unspecified: Secondary | ICD-10-CM | POA: Diagnosis not present

## 2017-01-03 DIAGNOSIS — E119 Type 2 diabetes mellitus without complications: Secondary | ICD-10-CM | POA: Diagnosis not present

## 2017-01-12 DIAGNOSIS — I10 Essential (primary) hypertension: Secondary | ICD-10-CM | POA: Diagnosis not present

## 2017-01-12 DIAGNOSIS — E559 Vitamin D deficiency, unspecified: Secondary | ICD-10-CM | POA: Diagnosis not present

## 2017-01-12 DIAGNOSIS — M810 Age-related osteoporosis without current pathological fracture: Secondary | ICD-10-CM | POA: Diagnosis not present

## 2017-01-12 DIAGNOSIS — E7800 Pure hypercholesterolemia, unspecified: Secondary | ICD-10-CM | POA: Diagnosis not present

## 2017-01-12 DIAGNOSIS — I2699 Other pulmonary embolism without acute cor pulmonale: Secondary | ICD-10-CM | POA: Diagnosis not present

## 2017-01-12 DIAGNOSIS — K219 Gastro-esophageal reflux disease without esophagitis: Secondary | ICD-10-CM | POA: Diagnosis not present

## 2017-01-12 DIAGNOSIS — E119 Type 2 diabetes mellitus without complications: Secondary | ICD-10-CM | POA: Diagnosis not present

## 2017-01-12 DIAGNOSIS — G4700 Insomnia, unspecified: Secondary | ICD-10-CM | POA: Diagnosis not present

## 2017-01-12 DIAGNOSIS — M79604 Pain in right leg: Secondary | ICD-10-CM | POA: Diagnosis not present

## 2017-01-12 DIAGNOSIS — M25561 Pain in right knee: Secondary | ICD-10-CM | POA: Diagnosis not present

## 2017-01-28 DIAGNOSIS — E119 Type 2 diabetes mellitus without complications: Secondary | ICD-10-CM | POA: Diagnosis not present

## 2017-01-28 DIAGNOSIS — R748 Abnormal levels of other serum enzymes: Secondary | ICD-10-CM | POA: Diagnosis not present

## 2017-01-28 DIAGNOSIS — N189 Chronic kidney disease, unspecified: Secondary | ICD-10-CM | POA: Diagnosis not present

## 2017-01-28 DIAGNOSIS — M810 Age-related osteoporosis without current pathological fracture: Secondary | ICD-10-CM | POA: Diagnosis not present

## 2017-01-28 DIAGNOSIS — K219 Gastro-esophageal reflux disease without esophagitis: Secondary | ICD-10-CM | POA: Diagnosis not present

## 2017-01-28 DIAGNOSIS — E559 Vitamin D deficiency, unspecified: Secondary | ICD-10-CM | POA: Diagnosis not present

## 2017-01-28 DIAGNOSIS — G4700 Insomnia, unspecified: Secondary | ICD-10-CM | POA: Diagnosis not present

## 2017-01-28 DIAGNOSIS — I10 Essential (primary) hypertension: Secondary | ICD-10-CM | POA: Diagnosis not present

## 2017-01-28 DIAGNOSIS — I2699 Other pulmonary embolism without acute cor pulmonale: Secondary | ICD-10-CM | POA: Diagnosis not present

## 2017-01-28 DIAGNOSIS — E7800 Pure hypercholesterolemia, unspecified: Secondary | ICD-10-CM | POA: Diagnosis not present

## 2017-02-13 DIAGNOSIS — I10 Essential (primary) hypertension: Secondary | ICD-10-CM | POA: Diagnosis not present

## 2017-02-13 DIAGNOSIS — H1045 Other chronic allergic conjunctivitis: Secondary | ICD-10-CM | POA: Diagnosis not present

## 2017-02-16 DIAGNOSIS — H524 Presbyopia: Secondary | ICD-10-CM | POA: Diagnosis not present

## 2017-02-16 DIAGNOSIS — I10 Essential (primary) hypertension: Secondary | ICD-10-CM | POA: Diagnosis not present

## 2017-02-18 DIAGNOSIS — R69 Illness, unspecified: Secondary | ICD-10-CM | POA: Diagnosis not present

## 2017-02-25 DIAGNOSIS — M79604 Pain in right leg: Secondary | ICD-10-CM | POA: Diagnosis not present

## 2017-02-25 DIAGNOSIS — E7800 Pure hypercholesterolemia, unspecified: Secondary | ICD-10-CM | POA: Diagnosis not present

## 2017-02-25 DIAGNOSIS — I2699 Other pulmonary embolism without acute cor pulmonale: Secondary | ICD-10-CM | POA: Diagnosis not present

## 2017-02-25 DIAGNOSIS — M545 Low back pain: Secondary | ICD-10-CM | POA: Diagnosis not present

## 2017-02-25 DIAGNOSIS — I10 Essential (primary) hypertension: Secondary | ICD-10-CM | POA: Diagnosis not present

## 2017-02-25 DIAGNOSIS — E119 Type 2 diabetes mellitus without complications: Secondary | ICD-10-CM | POA: Diagnosis not present

## 2017-02-25 DIAGNOSIS — M810 Age-related osteoporosis without current pathological fracture: Secondary | ICD-10-CM | POA: Diagnosis not present

## 2017-02-25 DIAGNOSIS — K219 Gastro-esophageal reflux disease without esophagitis: Secondary | ICD-10-CM | POA: Diagnosis not present

## 2017-02-25 DIAGNOSIS — E559 Vitamin D deficiency, unspecified: Secondary | ICD-10-CM | POA: Diagnosis not present

## 2017-02-25 DIAGNOSIS — G4700 Insomnia, unspecified: Secondary | ICD-10-CM | POA: Diagnosis not present

## 2017-03-16 DIAGNOSIS — N3281 Overactive bladder: Secondary | ICD-10-CM | POA: Diagnosis not present

## 2017-03-16 DIAGNOSIS — E559 Vitamin D deficiency, unspecified: Secondary | ICD-10-CM | POA: Diagnosis not present

## 2017-03-16 DIAGNOSIS — M545 Low back pain: Secondary | ICD-10-CM | POA: Diagnosis not present

## 2017-03-16 DIAGNOSIS — K219 Gastro-esophageal reflux disease without esophagitis: Secondary | ICD-10-CM | POA: Diagnosis not present

## 2017-03-16 DIAGNOSIS — E119 Type 2 diabetes mellitus without complications: Secondary | ICD-10-CM | POA: Diagnosis not present

## 2017-03-16 DIAGNOSIS — E7800 Pure hypercholesterolemia, unspecified: Secondary | ICD-10-CM | POA: Diagnosis not present

## 2017-03-16 DIAGNOSIS — I10 Essential (primary) hypertension: Secondary | ICD-10-CM | POA: Diagnosis not present

## 2017-03-16 DIAGNOSIS — M810 Age-related osteoporosis without current pathological fracture: Secondary | ICD-10-CM | POA: Diagnosis not present

## 2017-03-16 DIAGNOSIS — I2699 Other pulmonary embolism without acute cor pulmonale: Secondary | ICD-10-CM | POA: Diagnosis not present

## 2017-03-16 DIAGNOSIS — G4700 Insomnia, unspecified: Secondary | ICD-10-CM | POA: Diagnosis not present

## 2017-03-26 DIAGNOSIS — R1084 Generalized abdominal pain: Secondary | ICD-10-CM | POA: Diagnosis not present

## 2017-04-08 DIAGNOSIS — Z791 Long term (current) use of non-steroidal anti-inflammatories (NSAID): Secondary | ICD-10-CM | POA: Diagnosis not present

## 2017-04-08 DIAGNOSIS — K2930 Chronic superficial gastritis without bleeding: Secondary | ICD-10-CM | POA: Diagnosis not present

## 2017-04-08 DIAGNOSIS — R1013 Epigastric pain: Secondary | ICD-10-CM | POA: Diagnosis not present

## 2017-04-08 DIAGNOSIS — K449 Diaphragmatic hernia without obstruction or gangrene: Secondary | ICD-10-CM | POA: Diagnosis not present

## 2017-04-08 DIAGNOSIS — R1084 Generalized abdominal pain: Secondary | ICD-10-CM | POA: Diagnosis not present

## 2017-04-09 DIAGNOSIS — K3189 Other diseases of stomach and duodenum: Secondary | ICD-10-CM | POA: Diagnosis not present

## 2017-04-15 DIAGNOSIS — N281 Cyst of kidney, acquired: Secondary | ICD-10-CM | POA: Diagnosis not present

## 2017-04-15 DIAGNOSIS — R1084 Generalized abdominal pain: Secondary | ICD-10-CM | POA: Diagnosis not present

## 2017-04-21 DIAGNOSIS — K2950 Unspecified chronic gastritis without bleeding: Secondary | ICD-10-CM | POA: Diagnosis not present

## 2017-04-21 DIAGNOSIS — N281 Cyst of kidney, acquired: Secondary | ICD-10-CM | POA: Diagnosis not present

## 2017-04-21 DIAGNOSIS — R1084 Generalized abdominal pain: Secondary | ICD-10-CM | POA: Diagnosis not present

## 2017-04-22 DIAGNOSIS — N281 Cyst of kidney, acquired: Secondary | ICD-10-CM | POA: Diagnosis not present

## 2017-04-22 DIAGNOSIS — K219 Gastro-esophageal reflux disease without esophagitis: Secondary | ICD-10-CM | POA: Diagnosis not present

## 2017-04-22 DIAGNOSIS — I2699 Other pulmonary embolism without acute cor pulmonale: Secondary | ICD-10-CM | POA: Diagnosis not present

## 2017-04-22 DIAGNOSIS — N3281 Overactive bladder: Secondary | ICD-10-CM | POA: Diagnosis not present

## 2017-04-22 DIAGNOSIS — E119 Type 2 diabetes mellitus without complications: Secondary | ICD-10-CM | POA: Diagnosis not present

## 2017-04-22 DIAGNOSIS — E7800 Pure hypercholesterolemia, unspecified: Secondary | ICD-10-CM | POA: Diagnosis not present

## 2017-04-22 DIAGNOSIS — Z1389 Encounter for screening for other disorder: Secondary | ICD-10-CM | POA: Diagnosis not present

## 2017-04-22 DIAGNOSIS — E559 Vitamin D deficiency, unspecified: Secondary | ICD-10-CM | POA: Diagnosis not present

## 2017-04-22 DIAGNOSIS — M810 Age-related osteoporosis without current pathological fracture: Secondary | ICD-10-CM | POA: Diagnosis not present

## 2017-04-22 DIAGNOSIS — I10 Essential (primary) hypertension: Secondary | ICD-10-CM | POA: Diagnosis not present

## 2017-04-28 DIAGNOSIS — R42 Dizziness and giddiness: Secondary | ICD-10-CM | POA: Diagnosis not present

## 2017-04-28 DIAGNOSIS — R350 Frequency of micturition: Secondary | ICD-10-CM | POA: Diagnosis not present

## 2017-04-28 DIAGNOSIS — I517 Cardiomegaly: Secondary | ICD-10-CM | POA: Diagnosis not present

## 2017-05-04 DIAGNOSIS — R351 Nocturia: Secondary | ICD-10-CM | POA: Diagnosis not present

## 2017-05-04 DIAGNOSIS — N281 Cyst of kidney, acquired: Secondary | ICD-10-CM | POA: Diagnosis not present

## 2017-05-11 DIAGNOSIS — N200 Calculus of kidney: Secondary | ICD-10-CM | POA: Diagnosis not present

## 2017-05-11 DIAGNOSIS — N281 Cyst of kidney, acquired: Secondary | ICD-10-CM | POA: Diagnosis not present

## 2017-05-20 DIAGNOSIS — K219 Gastro-esophageal reflux disease without esophagitis: Secondary | ICD-10-CM | POA: Diagnosis not present

## 2017-05-20 DIAGNOSIS — E119 Type 2 diabetes mellitus without complications: Secondary | ICD-10-CM | POA: Diagnosis not present

## 2017-05-20 DIAGNOSIS — M545 Low back pain: Secondary | ICD-10-CM | POA: Diagnosis not present

## 2017-05-20 DIAGNOSIS — I2699 Other pulmonary embolism without acute cor pulmonale: Secondary | ICD-10-CM | POA: Diagnosis not present

## 2017-05-20 DIAGNOSIS — Z23 Encounter for immunization: Secondary | ICD-10-CM | POA: Diagnosis not present

## 2017-05-20 DIAGNOSIS — N3281 Overactive bladder: Secondary | ICD-10-CM | POA: Diagnosis not present

## 2017-05-20 DIAGNOSIS — M810 Age-related osteoporosis without current pathological fracture: Secondary | ICD-10-CM | POA: Diagnosis not present

## 2017-05-20 DIAGNOSIS — E559 Vitamin D deficiency, unspecified: Secondary | ICD-10-CM | POA: Diagnosis not present

## 2017-05-20 DIAGNOSIS — E7800 Pure hypercholesterolemia, unspecified: Secondary | ICD-10-CM | POA: Diagnosis not present

## 2017-05-20 DIAGNOSIS — I10 Essential (primary) hypertension: Secondary | ICD-10-CM | POA: Diagnosis not present

## 2017-05-27 DIAGNOSIS — N281 Cyst of kidney, acquired: Secondary | ICD-10-CM | POA: Diagnosis not present

## 2017-05-27 DIAGNOSIS — R351 Nocturia: Secondary | ICD-10-CM | POA: Diagnosis not present

## 2017-06-01 DIAGNOSIS — K219 Gastro-esophageal reflux disease without esophagitis: Secondary | ICD-10-CM | POA: Diagnosis not present

## 2017-06-01 DIAGNOSIS — M810 Age-related osteoporosis without current pathological fracture: Secondary | ICD-10-CM | POA: Diagnosis not present

## 2017-06-01 DIAGNOSIS — M545 Low back pain: Secondary | ICD-10-CM | POA: Diagnosis not present

## 2017-06-01 DIAGNOSIS — I10 Essential (primary) hypertension: Secondary | ICD-10-CM | POA: Diagnosis not present

## 2017-06-01 DIAGNOSIS — E559 Vitamin D deficiency, unspecified: Secondary | ICD-10-CM | POA: Diagnosis not present

## 2017-06-01 DIAGNOSIS — R42 Dizziness and giddiness: Secondary | ICD-10-CM | POA: Diagnosis not present

## 2017-06-01 DIAGNOSIS — E119 Type 2 diabetes mellitus without complications: Secondary | ICD-10-CM | POA: Diagnosis not present

## 2017-06-01 DIAGNOSIS — M79604 Pain in right leg: Secondary | ICD-10-CM | POA: Diagnosis not present

## 2017-06-01 DIAGNOSIS — I2699 Other pulmonary embolism without acute cor pulmonale: Secondary | ICD-10-CM | POA: Diagnosis not present

## 2017-06-01 DIAGNOSIS — E7800 Pure hypercholesterolemia, unspecified: Secondary | ICD-10-CM | POA: Diagnosis not present

## 2017-06-16 ENCOUNTER — Encounter (INDEPENDENT_AMBULATORY_CARE_PROVIDER_SITE_OTHER): Payer: Self-pay

## 2017-06-16 ENCOUNTER — Other Ambulatory Visit: Payer: Self-pay

## 2017-06-16 ENCOUNTER — Encounter: Payer: Self-pay | Admitting: Neurology

## 2017-06-16 ENCOUNTER — Ambulatory Visit: Payer: Medicare HMO | Admitting: Neurology

## 2017-06-16 DIAGNOSIS — H81399 Other peripheral vertigo, unspecified ear: Secondary | ICD-10-CM

## 2017-06-16 DIAGNOSIS — R42 Dizziness and giddiness: Secondary | ICD-10-CM | POA: Diagnosis not present

## 2017-06-16 HISTORY — DX: Dizziness and giddiness: R42

## 2017-06-16 MED ORDER — ALPRAZOLAM 0.5 MG PO TABS: ORAL_TABLET | ORAL | 0 refills | Status: DC

## 2017-06-16 NOTE — Progress Notes (Signed)
Reason for visit: Dizziness  Referring physician: Dr. Elsie LincolnLee   Kristy Pope is a 71 y.o. female  History of present illness:  Kristy Pope is a [age] year old right-handed black female with a history of dizziness that dates back at least 2 years.  The patient has had some gradual worsening of the dizziness over that timeframe.  She denies any true vertigo, she does have a lightheaded floating sensation or sensation of near syncope.  The patient has never blacked out.  She indicates that she feels better when she is lying down, worse when she is standing.  She does not note any particular head position that would bring on dizziness.  She has no other associated symptoms such as numbness or weakness of the extremities, slurred speech, double vision or loss of vision, or difficulty with swallowing.  The patient reports no ear pain or ringing in the ears or hearing loss.  She does have occasional headaches but this is not clearly associated with the dizziness.  The patient does have a history of hypertension and diabetes, she does not relate the onset of the dizziness with any medication that was started around the time of onset.  The patient feels that the balance is slightly off, she has not had any falls.  Which she is sent to this office for further evaluation.  The patient denies any neck pain or neck stiffness.  She denies any allergy symptoms or sinus drainage.  Past Medical History:  Diagnosis Date  . Arthritis   . GERD (gastroesophageal reflux disease)   . Hyperlipidemia   . Hypertension   . Nocturia   . Numbness    RT LEG  . Osteoporosis   . Sleeping difficulty    TAKES AMBIEN PRN  . Spinal stenosis     Past Surgical History:  Procedure Laterality Date  . ABDOMINAL HYSTERECTOMY    . ROTATOR CUFF REPAIR  2013   LEFT    History reviewed. No pertinent family history.  Social history:  reports that  has never smoked. she has never used smokeless tobacco. She reports that she does not  drink alcohol or use drugs.  Medications:  Prior to Admission medications   Medication Sig Start Date End Date Taking? Authorizing Provider  alendronate (FOSAMAX) 70 MG tablet Take 70 mg by mouth every 7 (seven) days. Reported on 09/10/2015   Yes [provider]  amLODipine (NORVASC) 10 MG tablet Take 10 mg by mouth every evening.   Yes [provider]  aspirin 81 MG tablet Take 81 mg by mouth daily.   Yes [provider]  atorvastatin (LIPITOR) 80 MG tablet Take 80 mg by mouth every evening.   Yes [provider]  ezetimibe (ZETIA) 10 MG tablet Take 10 mg by mouth every evening.   Yes [provider]  meclizine (ANTIVERT) 12.5 MG tablet Take 12.5 mg as needed by mouth for dizziness.   Yes [provider]  Multiple Vitamin (MULTIVITAMIN) capsule Take 1 capsule by mouth daily.   Yes [provider]  omeprazole (PRILOSEC) 20 MG capsule Take 20 mg by mouth daily.   Yes [provider]  oxybutynin (DITROPAN) 5 MG tablet Take 5 mg daily by mouth.   Yes [provider]  traMADol (ULTRAM) 50 MG tablet Take by mouth 3 (three) times daily.   Yes [provider]  zolpidem (AMBIEN) 10 MG tablet Take 10 mg by mouth at bedtime as needed for sleep.   Yes [provider]     No Known Allergies  ROS:  Out of a complete 14 system review of symptoms, the patient complains only of the following symptoms, and all other reviewed systems are negative.  Weight loss Moles Urination problems Aching muscles Dizziness Not enough sleep Sleepiness, restless legs  Blood pressure (!) 150/80, pulse 69, height 5\' 1"  (1.549 m), weight 147 lb (66.7 kg), SpO2 96 %.   Blood pressure, sitting, right arm is 144/84.  Blood pressure, right arm, standing is 144/80.  Physical Exam  General: The patient is alert and cooperative at the time of the examination.  Eyes: Pupils are equal, round, and reactive to light. Discs are  flat bilaterally.  Neck: The neck is supple, no carotid bruits are noted.  Respiratory: The respiratory examination is clear.  Cardiovascular: The cardiovascular examination reveals a regular rate and rhythm, no obvious murmurs or rubs are noted.  Skin: Extremities are without significant edema.  Neurologic Exam  Mental status: The patient is alert and oriented x 3 at the time of the examination. The patient has apparent normal recent and remote memory, with an apparently normal attention span and concentration ability.  Cranial nerves: Facial symmetry is present. There is good sensation of the face to pinprick and soft touch bilaterally. The strength of the facial muscles and the muscles to head turning and shoulder shrug are normal bilaterally. Speech is well enunciated, no aphasia or dysarthria is noted. Extraocular movements are full, with superior gaze there is divergence of the eyes, exotropia on the right eye. Visual fields are full. The tongue is midline, and the patient has symmetric elevation of the soft palate. No obvious hearing deficits are noted.  Motor: The motor testing reveals 5 over 5 strength of all 4 extremities. Good symmetric motor tone is noted throughout.  Sensory: Sensory testing is intact to pinprick, soft touch, vibration sensation, and position sense on all 4 extremities. No evidence of extinction is noted.  Coordination: Cerebellar testing reveals good finger-nose-finger and heel-to-shin bilaterally.  Gait and station: Gait is normal. Tandem gait is slightly unsteady, the patient is able to perform independently. Romberg is negative. No drift is seen.  Reflexes: Deep tendon reflexes are symmetric and normal bilaterally. Toes are downgoing bilaterally.   Assessment/Plan:   1. Chronic dizziness  The patient does have risk factors for stroke such as hypertension and diabetes, MRI of the brain will be done.  The patient does not appear to have orthostatic  hypotension.  The dizziness is nonspecific, this certainly could be related to a medication.  The etiology of the dizziness is not clear.  A carotid Doppler study will be done.  She will follow-up in 4-5 months.  Marlan Palau. Keith Shontia Gillooly MD 06/16/2017 9:41 AM  [facility] [address] [place], [place] [id number]  Phone [phone] Fax [phone]

## 2017-06-16 NOTE — Patient Instructions (Signed)
   MRI of the brain will be done and a carotid doppler will be scheduled to look at the circulation to the head.

## 2017-06-16 NOTE — Progress Notes (Signed)
Faxed printed/signed rx xanax to [facility],  at [phone]. Received fax confirmation.

## 2017-07-08 DIAGNOSIS — R351 Nocturia: Secondary | ICD-10-CM | POA: Diagnosis not present

## 2017-07-08 DIAGNOSIS — N281 Cyst of kidney, acquired: Secondary | ICD-10-CM | POA: Diagnosis not present

## 2017-07-09 ENCOUNTER — Telehealth: Payer: Self-pay | Admitting: Neurology

## 2017-07-09 NOTE — Telephone Encounter (Signed)
Pt is calling stating that she would like to have her MRI performed in [place] where she lives. I have informed our point of contact person for this and made her aware and informed the pt that once we have insurance approval we will send it to [facility].Pt verbalized understanding. Pt had no questions at this time but was encouraged to call back if questions arise.

## 2017-07-10 NOTE — Telephone Encounter (Signed)
Left a detail message on her phone stating that her MRI is scheduled at [facility] for Thursday 07/16/17 arrival time is 11:45 am. If she need to reschedule or cancel for any reason their phone number is [phone].

## 2017-07-16 ENCOUNTER — Encounter: Payer: Self-pay | Admitting: Neurology

## 2017-07-20 NOTE — Telephone Encounter (Signed)
Received fax notification from [facility] that pt MRI r/s for 07/23/17.

## 2017-07-21 DIAGNOSIS — I10 Essential (primary) hypertension: Secondary | ICD-10-CM | POA: Diagnosis not present

## 2017-07-21 DIAGNOSIS — E119 Type 2 diabetes mellitus without complications: Secondary | ICD-10-CM | POA: Diagnosis not present

## 2017-07-21 DIAGNOSIS — M545 Low back pain: Secondary | ICD-10-CM | POA: Diagnosis not present

## 2017-07-21 DIAGNOSIS — M79604 Pain in right leg: Secondary | ICD-10-CM | POA: Diagnosis not present

## 2017-07-21 DIAGNOSIS — K219 Gastro-esophageal reflux disease without esophagitis: Secondary | ICD-10-CM | POA: Diagnosis not present

## 2017-07-21 DIAGNOSIS — J208 Acute bronchitis due to other specified organisms: Secondary | ICD-10-CM | POA: Diagnosis not present

## 2017-07-21 DIAGNOSIS — E559 Vitamin D deficiency, unspecified: Secondary | ICD-10-CM | POA: Diagnosis not present

## 2017-07-21 DIAGNOSIS — M810 Age-related osteoporosis without current pathological fracture: Secondary | ICD-10-CM | POA: Diagnosis not present

## 2017-07-21 DIAGNOSIS — E7800 Pure hypercholesterolemia, unspecified: Secondary | ICD-10-CM | POA: Diagnosis not present

## 2017-07-21 DIAGNOSIS — I2699 Other pulmonary embolism without acute cor pulmonale: Secondary | ICD-10-CM | POA: Diagnosis not present

## 2017-07-23 DIAGNOSIS — R42 Dizziness and giddiness: Secondary | ICD-10-CM | POA: Diagnosis not present

## 2017-07-23 DIAGNOSIS — H81399 Other peripheral vertigo, unspecified ear: Secondary | ICD-10-CM | POA: Diagnosis not present

## 2017-07-29 ENCOUNTER — Telehealth: Payer: Self-pay | Admitting: Neurology

## 2017-07-29 DIAGNOSIS — J208 Acute bronchitis due to other specified organisms: Secondary | ICD-10-CM | POA: Diagnosis not present

## 2017-07-29 DIAGNOSIS — M25561 Pain in right knee: Secondary | ICD-10-CM | POA: Diagnosis not present

## 2017-07-29 DIAGNOSIS — E559 Vitamin D deficiency, unspecified: Secondary | ICD-10-CM | POA: Diagnosis not present

## 2017-07-29 DIAGNOSIS — M545 Low back pain: Secondary | ICD-10-CM | POA: Diagnosis not present

## 2017-07-29 DIAGNOSIS — K219 Gastro-esophageal reflux disease without esophagitis: Secondary | ICD-10-CM | POA: Diagnosis not present

## 2017-07-29 DIAGNOSIS — I10 Essential (primary) hypertension: Secondary | ICD-10-CM | POA: Diagnosis not present

## 2017-07-29 DIAGNOSIS — I2699 Other pulmonary embolism without acute cor pulmonale: Secondary | ICD-10-CM | POA: Diagnosis not present

## 2017-07-29 DIAGNOSIS — E7800 Pure hypercholesterolemia, unspecified: Secondary | ICD-10-CM | POA: Diagnosis not present

## 2017-07-29 DIAGNOSIS — E119 Type 2 diabetes mellitus without complications: Secondary | ICD-10-CM | POA: Diagnosis not present

## 2017-07-29 DIAGNOSIS — M810 Age-related osteoporosis without current pathological fracture: Secondary | ICD-10-CM | POA: Diagnosis not present

## 2017-07-29 NOTE — Telephone Encounter (Signed)
I called the patient.  MRI of the brain shows mild to moderate small vessel ischemic changes, carotid Doppler study is pending.  The etiology of the dizziness may be difficult to ascertain, her reports of dizziness are quite nonspecific.  The patient is on low-dose aspirin, she has a history of diabetes and hypertension.   MRI brain 07/16/17:  MRI of the brain shows no acute changes, mild to moderate small vessel disease and basal ganglia infarcts associated with chronic small vessel disease.

## 2017-08-07 DIAGNOSIS — R351 Nocturia: Secondary | ICD-10-CM | POA: Diagnosis not present

## 2017-08-07 DIAGNOSIS — N281 Cyst of kidney, acquired: Secondary | ICD-10-CM | POA: Diagnosis not present

## 2017-08-21 DIAGNOSIS — I10 Essential (primary) hypertension: Secondary | ICD-10-CM | POA: Diagnosis not present

## 2017-08-21 DIAGNOSIS — M810 Age-related osteoporosis without current pathological fracture: Secondary | ICD-10-CM | POA: Diagnosis not present

## 2017-08-21 DIAGNOSIS — I2699 Other pulmonary embolism without acute cor pulmonale: Secondary | ICD-10-CM | POA: Diagnosis not present

## 2017-08-21 DIAGNOSIS — M79604 Pain in right leg: Secondary | ICD-10-CM | POA: Diagnosis not present

## 2017-08-21 DIAGNOSIS — M545 Low back pain: Secondary | ICD-10-CM | POA: Diagnosis not present

## 2017-08-21 DIAGNOSIS — K219 Gastro-esophageal reflux disease without esophagitis: Secondary | ICD-10-CM | POA: Diagnosis not present

## 2017-08-21 DIAGNOSIS — M25562 Pain in left knee: Secondary | ICD-10-CM | POA: Diagnosis not present

## 2017-08-21 DIAGNOSIS — E119 Type 2 diabetes mellitus without complications: Secondary | ICD-10-CM | POA: Diagnosis not present

## 2017-08-21 DIAGNOSIS — E7800 Pure hypercholesterolemia, unspecified: Secondary | ICD-10-CM | POA: Diagnosis not present

## 2017-08-21 DIAGNOSIS — E559 Vitamin D deficiency, unspecified: Secondary | ICD-10-CM | POA: Diagnosis not present

## 2017-08-28 DIAGNOSIS — M545 Low back pain: Secondary | ICD-10-CM | POA: Diagnosis not present

## 2017-08-28 DIAGNOSIS — E559 Vitamin D deficiency, unspecified: Secondary | ICD-10-CM | POA: Diagnosis not present

## 2017-08-28 DIAGNOSIS — M79604 Pain in right leg: Secondary | ICD-10-CM | POA: Diagnosis not present

## 2017-08-28 DIAGNOSIS — M810 Age-related osteoporosis without current pathological fracture: Secondary | ICD-10-CM | POA: Diagnosis not present

## 2017-08-28 DIAGNOSIS — E7800 Pure hypercholesterolemia, unspecified: Secondary | ICD-10-CM | POA: Diagnosis not present

## 2017-08-28 DIAGNOSIS — I2699 Other pulmonary embolism without acute cor pulmonale: Secondary | ICD-10-CM | POA: Diagnosis not present

## 2017-08-28 DIAGNOSIS — E119 Type 2 diabetes mellitus without complications: Secondary | ICD-10-CM | POA: Diagnosis not present

## 2017-08-28 DIAGNOSIS — K219 Gastro-esophageal reflux disease without esophagitis: Secondary | ICD-10-CM | POA: Diagnosis not present

## 2017-08-28 DIAGNOSIS — R05 Cough: Secondary | ICD-10-CM | POA: Diagnosis not present

## 2017-08-28 DIAGNOSIS — I10 Essential (primary) hypertension: Secondary | ICD-10-CM | POA: Diagnosis not present

## 2017-08-31 DIAGNOSIS — R079 Chest pain, unspecified: Secondary | ICD-10-CM | POA: Diagnosis not present

## 2017-08-31 DIAGNOSIS — R05 Cough: Secondary | ICD-10-CM | POA: Diagnosis not present

## 2017-09-03 DIAGNOSIS — R0789 Other chest pain: Secondary | ICD-10-CM | POA: Diagnosis not present

## 2017-09-03 DIAGNOSIS — R079 Chest pain, unspecified: Secondary | ICD-10-CM | POA: Diagnosis not present

## 2017-09-03 DIAGNOSIS — R072 Precordial pain: Secondary | ICD-10-CM | POA: Diagnosis not present

## 2017-09-03 DIAGNOSIS — K2900 Acute gastritis without bleeding: Secondary | ICD-10-CM | POA: Diagnosis not present

## 2017-09-04 DIAGNOSIS — R079 Chest pain, unspecified: Secondary | ICD-10-CM | POA: Diagnosis not present

## 2017-09-11 DIAGNOSIS — E559 Vitamin D deficiency, unspecified: Secondary | ICD-10-CM | POA: Diagnosis not present

## 2017-09-11 DIAGNOSIS — I2699 Other pulmonary embolism without acute cor pulmonale: Secondary | ICD-10-CM | POA: Diagnosis not present

## 2017-09-11 DIAGNOSIS — K219 Gastro-esophageal reflux disease without esophagitis: Secondary | ICD-10-CM | POA: Diagnosis not present

## 2017-09-11 DIAGNOSIS — J208 Acute bronchitis due to other specified organisms: Secondary | ICD-10-CM | POA: Diagnosis not present

## 2017-09-11 DIAGNOSIS — M79604 Pain in right leg: Secondary | ICD-10-CM | POA: Diagnosis not present

## 2017-09-11 DIAGNOSIS — E119 Type 2 diabetes mellitus without complications: Secondary | ICD-10-CM | POA: Diagnosis not present

## 2017-09-11 DIAGNOSIS — M810 Age-related osteoporosis without current pathological fracture: Secondary | ICD-10-CM | POA: Diagnosis not present

## 2017-09-11 DIAGNOSIS — M545 Low back pain: Secondary | ICD-10-CM | POA: Diagnosis not present

## 2017-09-11 DIAGNOSIS — I10 Essential (primary) hypertension: Secondary | ICD-10-CM | POA: Diagnosis not present

## 2017-09-11 DIAGNOSIS — E7800 Pure hypercholesterolemia, unspecified: Secondary | ICD-10-CM | POA: Diagnosis not present

## 2017-09-18 DIAGNOSIS — Z0000 Encounter for general adult medical examination without abnormal findings: Secondary | ICD-10-CM | POA: Diagnosis not present

## 2017-09-18 DIAGNOSIS — Z9181 History of falling: Secondary | ICD-10-CM | POA: Diagnosis not present

## 2017-09-18 DIAGNOSIS — Z23 Encounter for immunization: Secondary | ICD-10-CM | POA: Diagnosis not present

## 2017-09-18 DIAGNOSIS — Z1331 Encounter for screening for depression: Secondary | ICD-10-CM | POA: Diagnosis not present

## 2017-09-18 DIAGNOSIS — E119 Type 2 diabetes mellitus without complications: Secondary | ICD-10-CM | POA: Diagnosis not present

## 2017-09-18 DIAGNOSIS — I2699 Other pulmonary embolism without acute cor pulmonale: Secondary | ICD-10-CM | POA: Diagnosis not present

## 2017-09-18 DIAGNOSIS — E7800 Pure hypercholesterolemia, unspecified: Secondary | ICD-10-CM | POA: Diagnosis not present

## 2017-09-18 DIAGNOSIS — E785 Hyperlipidemia, unspecified: Secondary | ICD-10-CM | POA: Diagnosis not present

## 2017-09-18 DIAGNOSIS — I10 Essential (primary) hypertension: Secondary | ICD-10-CM | POA: Diagnosis not present

## 2017-09-18 DIAGNOSIS — E559 Vitamin D deficiency, unspecified: Secondary | ICD-10-CM | POA: Diagnosis not present

## 2017-10-05 DIAGNOSIS — E7800 Pure hypercholesterolemia, unspecified: Secondary | ICD-10-CM | POA: Diagnosis not present

## 2017-10-05 DIAGNOSIS — N3281 Overactive bladder: Secondary | ICD-10-CM | POA: Diagnosis not present

## 2017-10-05 DIAGNOSIS — N281 Cyst of kidney, acquired: Secondary | ICD-10-CM | POA: Diagnosis not present

## 2017-10-05 DIAGNOSIS — E119 Type 2 diabetes mellitus without complications: Secondary | ICD-10-CM | POA: Diagnosis not present

## 2017-10-05 DIAGNOSIS — E559 Vitamin D deficiency, unspecified: Secondary | ICD-10-CM | POA: Diagnosis not present

## 2017-10-05 DIAGNOSIS — I2699 Other pulmonary embolism without acute cor pulmonale: Secondary | ICD-10-CM | POA: Diagnosis not present

## 2017-10-05 DIAGNOSIS — K219 Gastro-esophageal reflux disease without esophagitis: Secondary | ICD-10-CM | POA: Diagnosis not present

## 2017-10-05 DIAGNOSIS — I10 Essential (primary) hypertension: Secondary | ICD-10-CM | POA: Diagnosis not present

## 2017-10-05 DIAGNOSIS — R42 Dizziness and giddiness: Secondary | ICD-10-CM | POA: Diagnosis not present

## 2017-10-05 DIAGNOSIS — M810 Age-related osteoporosis without current pathological fracture: Secondary | ICD-10-CM | POA: Diagnosis not present

## 2017-10-08 DIAGNOSIS — R69 Illness, unspecified: Secondary | ICD-10-CM | POA: Diagnosis not present

## 2017-10-16 DIAGNOSIS — I10 Essential (primary) hypertension: Secondary | ICD-10-CM | POA: Diagnosis not present

## 2017-10-16 DIAGNOSIS — Z79899 Other long term (current) drug therapy: Secondary | ICD-10-CM | POA: Diagnosis not present

## 2017-10-16 DIAGNOSIS — R42 Dizziness and giddiness: Secondary | ICD-10-CM | POA: Diagnosis not present

## 2017-10-16 DIAGNOSIS — I700 Atherosclerosis of aorta: Secondary | ICD-10-CM | POA: Diagnosis not present

## 2017-10-16 DIAGNOSIS — N390 Urinary tract infection, site not specified: Secondary | ICD-10-CM | POA: Diagnosis not present

## 2017-10-21 DIAGNOSIS — I2699 Other pulmonary embolism without acute cor pulmonale: Secondary | ICD-10-CM | POA: Diagnosis not present

## 2017-10-21 DIAGNOSIS — E119 Type 2 diabetes mellitus without complications: Secondary | ICD-10-CM | POA: Diagnosis not present

## 2017-10-21 DIAGNOSIS — N3281 Overactive bladder: Secondary | ICD-10-CM | POA: Diagnosis not present

## 2017-10-21 DIAGNOSIS — K219 Gastro-esophageal reflux disease without esophagitis: Secondary | ICD-10-CM | POA: Diagnosis not present

## 2017-10-21 DIAGNOSIS — M810 Age-related osteoporosis without current pathological fracture: Secondary | ICD-10-CM | POA: Diagnosis not present

## 2017-10-21 DIAGNOSIS — H6122 Impacted cerumen, left ear: Secondary | ICD-10-CM | POA: Diagnosis not present

## 2017-10-21 DIAGNOSIS — I10 Essential (primary) hypertension: Secondary | ICD-10-CM | POA: Diagnosis not present

## 2017-10-21 DIAGNOSIS — E559 Vitamin D deficiency, unspecified: Secondary | ICD-10-CM | POA: Diagnosis not present

## 2017-10-21 DIAGNOSIS — N281 Cyst of kidney, acquired: Secondary | ICD-10-CM | POA: Diagnosis not present

## 2017-10-21 DIAGNOSIS — E7800 Pure hypercholesterolemia, unspecified: Secondary | ICD-10-CM | POA: Diagnosis not present

## 2017-10-29 DIAGNOSIS — K219 Gastro-esophageal reflux disease without esophagitis: Secondary | ICD-10-CM | POA: Diagnosis not present

## 2017-10-29 DIAGNOSIS — J392 Other diseases of pharynx: Secondary | ICD-10-CM | POA: Diagnosis not present

## 2017-11-17 DIAGNOSIS — I2699 Other pulmonary embolism without acute cor pulmonale: Secondary | ICD-10-CM | POA: Diagnosis not present

## 2017-11-17 DIAGNOSIS — R42 Dizziness and giddiness: Secondary | ICD-10-CM | POA: Diagnosis not present

## 2017-11-17 DIAGNOSIS — N3281 Overactive bladder: Secondary | ICD-10-CM | POA: Diagnosis not present

## 2017-11-17 DIAGNOSIS — E559 Vitamin D deficiency, unspecified: Secondary | ICD-10-CM | POA: Diagnosis not present

## 2017-11-17 DIAGNOSIS — M810 Age-related osteoporosis without current pathological fracture: Secondary | ICD-10-CM | POA: Diagnosis not present

## 2017-11-17 DIAGNOSIS — I10 Essential (primary) hypertension: Secondary | ICD-10-CM | POA: Diagnosis not present

## 2017-11-17 DIAGNOSIS — G4700 Insomnia, unspecified: Secondary | ICD-10-CM | POA: Diagnosis not present

## 2017-11-17 DIAGNOSIS — E119 Type 2 diabetes mellitus without complications: Secondary | ICD-10-CM | POA: Diagnosis not present

## 2017-11-17 DIAGNOSIS — E7800 Pure hypercholesterolemia, unspecified: Secondary | ICD-10-CM | POA: Diagnosis not present

## 2017-11-17 DIAGNOSIS — N281 Cyst of kidney, acquired: Secondary | ICD-10-CM | POA: Diagnosis not present

## 2017-11-25 DIAGNOSIS — M810 Age-related osteoporosis without current pathological fracture: Secondary | ICD-10-CM | POA: Diagnosis not present

## 2017-11-25 DIAGNOSIS — Z1231 Encounter for screening mammogram for malignant neoplasm of breast: Secondary | ICD-10-CM | POA: Diagnosis not present

## 2017-11-25 DIAGNOSIS — N959 Unspecified menopausal and perimenopausal disorder: Secondary | ICD-10-CM | POA: Diagnosis not present

## 2017-11-26 ENCOUNTER — Ambulatory Visit: Payer: Medicare HMO | Admitting: Neurology

## 2017-11-26 ENCOUNTER — Encounter: Payer: Self-pay | Admitting: Neurology

## 2017-11-26 VITALS — BP 126/73 | HR 69 | Ht 61.0 in | Wt 145.5 lb

## 2017-11-26 DIAGNOSIS — R42 Dizziness and giddiness: Secondary | ICD-10-CM

## 2017-11-26 MED ORDER — TOPIRAMATE 25 MG PO TABS: 25.0000 mg | ORAL_TABLET | Freq: Two times a day (BID) | ORAL | 3 refills | Status: DC

## 2017-11-26 NOTE — Patient Instructions (Signed)
   We will reorder a carotid doppler study.  Start Topamax 25 mg daily for 2 weeks, then take one twice a day.   Topamax (topiramate) is a seizure medication that has an FDA approval for seizures and for migraine headache. Potential side effects of this medication include weight loss, cognitive slowing, tingling in the fingers and toes, and carbonated drinks will taste bad. If any significant side effects are noted on this drug, please contact our office.

## 2017-11-26 NOTE — Progress Notes (Signed)
Reason for visit: Dizziness  Kristy Pope is an 72 y.o. female  History of present illness:  Kristy Pope is a 72 year old right-handed black female with a history of chronic dizziness.  The dizziness is dating back greater than 2 years, the dizziness is present at all times.  It may be slightly better when she is lying down, but the sensation of lightheadedness never goes away.  The patient reports no falls, she has not had any numbness or weakness of the extremities.  She was set up for MRI of the brain that shows a moderate level small vessel ischemic changes.  She was set up for a carotid Doppler study but this was never done.  She is on some meclizine taking 12.5 mg 3 times a day if needed which she believes offers some slight benefit.  The patient does not correlate the dizziness with onset of any medication she is on.  She is no longer taking sleeping pills.  She has reported a one-month history of sharp shooting pains that migrate around the head, front or back.  She does have a history of intermittent headaches otherwise.  The patient returns to this office for an evaluation.  Past Medical History:  Diagnosis Date  . Arthritis   . Dizziness 06/16/2017  . GERD (gastroesophageal reflux disease)   . Hyperlipidemia   . Hypertension   . Nocturia   . Numbness    RT LEG  . Osteoporosis   . Sleeping difficulty    TAKES AMBIEN PRN  . Spinal stenosis     Past Surgical History:  Procedure Laterality Date  . ABDOMINAL HYSTERECTOMY    . LUMBAR LAMINECTOMY/DECOMPRESSION MICRODISCECTOMY N/A 02/22/2013   Procedure: LUMBAR LAMINECTOMY/COMPLETE DECOMPRESSION L4 - L5 1 LEVEL;  Surgeon: Jacki Conesonald A Gioffre, MD;  Location: [facility];  Service: Orthopedics;  Laterality: N/A;  . ROTATOR CUFF REPAIR  2013   LEFT    No family history on file.  Social history:  reports that she has never smoked. She has never used smokeless tobacco. She reports that she does not drink alcohol or use drugs.   No Known  Allergies  Medications:  Prior to Admission medications   Medication Sig Start Date End Date Taking? Authorizing Provider  alendronate (FOSAMAX) 70 MG tablet Take 70 mg by mouth every 7 (seven) days. Reported on 09/10/2015   Yes [provider]  ALPRAZolam Prudy Feeler(XANAX) 0.5 MG tablet Take 2 tablets approximately 45 minutes prior to the MRI study, take a third tablet if needed. 06/16/17  Yes York SpanielWillis, Jamorion Gomillion K, MD  amLODipine (NORVASC) 10 MG tablet Take 10 mg by mouth every evening.   Yes [provider]  aspirin 81 MG tablet Take 81 mg by mouth daily.   Yes [provider]  atorvastatin (LIPITOR) 80 MG tablet Take 80 mg by mouth every evening.   Yes [provider]  ezetimibe (ZETIA) 10 MG tablet Take 10 mg by mouth every evening.   Yes [provider]  meclizine (ANTIVERT) 12.5 MG tablet Take 12.5 mg as needed by mouth for dizziness.   Yes [provider]  Multiple Vitamin (MULTIVITAMIN) capsule Take 1 capsule by mouth daily.   Yes [provider]  omeprazole (PRILOSEC) 20 MG capsule Take 20 mg by mouth daily.   Yes [provider]  oxybutynin (DITROPAN) 5 MG tablet Take 5 mg daily by mouth.   Yes [provider]  traMADol (ULTRAM) 50 MG tablet Take by mouth 3 (three) times  daily.   Yes [provider]  zolpidem (AMBIEN) 10 MG tablet Take 10 mg by mouth at bedtime as needed for sleep.   Yes [provider]    ROS:  Out of a complete 14 system review of symptoms, the patient complains only of the following symptoms, and all other reviewed systems are negative.  Dizziness, headache, weakness   Blood pressure 126/73, pulse 69, height 5\' 1"  (1.549 m), weight 145 lb 8 oz (66 kg).   Physical Exam  General: The patient is alert and cooperative at the time of the examination.  Skin: No significant peripheral edema is noted.   Neurologic Exam  Mental status: The patient is alert and oriented x 3 at  the time of the examination. The patient has apparent normal recent and remote memory, with an apparently normal attention span and concentration ability.   Cranial nerves: Facial symmetry is present. Speech is normal, no aphasia or dysarthria is noted. Extraocular movements are full. Visual fields are full.  Motor: The patient has good strength in all 4 extremities.  Sensory examination: Soft touch sensation is symmetric on the face, arms, and legs.  Coordination: The patient has good finger-nose-finger and heel-to-shin bilaterally.  Gait and station: The patient has a normal gait. Tandem gait is normal. Romberg is negative. No drift is seen.  Reflexes: Deep tendon reflexes are symmetric.   MRI brain 07/16/17:  MRI of the brain shows no acute changes, mild to moderate small vessel disease and basal ganglia infarcts associated with chronic small vessel disease.    Assessment/Plan:  1.  Nonspecific dizziness, chronic  2.  Headache  The patient will be sent back for a carotid Doppler study.  The patient does have small vessel disease which could potentially be a source of her chronic dizziness.  The patient is now having some headaches and some sharp lancinating pains on the head.  She will be started on Topamax taking 25 mg daily for 2 weeks and go to 25 mg twice daily.  She will follow-up in 5 months.  She will call for any dose adjustments of the medication.  Marlan Palau MD 11/26/2017 12:15 PM  [facility] [address] [place], [place] [address]  Phone [phone] Fax [phone]

## 2017-12-07 DIAGNOSIS — N952 Postmenopausal atrophic vaginitis: Secondary | ICD-10-CM | POA: Diagnosis not present

## 2017-12-07 DIAGNOSIS — R300 Dysuria: Secondary | ICD-10-CM | POA: Diagnosis not present

## 2017-12-08 DIAGNOSIS — R7301 Impaired fasting glucose: Secondary | ICD-10-CM | POA: Diagnosis not present

## 2017-12-08 DIAGNOSIS — R42 Dizziness and giddiness: Secondary | ICD-10-CM | POA: Diagnosis not present

## 2017-12-08 DIAGNOSIS — K219 Gastro-esophageal reflux disease without esophagitis: Secondary | ICD-10-CM | POA: Diagnosis not present

## 2017-12-08 DIAGNOSIS — R69 Illness, unspecified: Secondary | ICD-10-CM | POA: Diagnosis not present

## 2017-12-08 DIAGNOSIS — E782 Mixed hyperlipidemia: Secondary | ICD-10-CM | POA: Diagnosis not present

## 2017-12-08 DIAGNOSIS — Z6827 Body mass index (BMI) 27.0-27.9, adult: Secondary | ICD-10-CM | POA: Diagnosis not present

## 2017-12-08 DIAGNOSIS — I10 Essential (primary) hypertension: Secondary | ICD-10-CM | POA: Diagnosis not present

## 2017-12-24 ENCOUNTER — Ambulatory Visit (HOSPITAL_COMMUNITY): Admission: RE | Admit: 2017-12-24 | Payer: Medicare HMO | Source: Ambulatory Visit

## 2017-12-24 DIAGNOSIS — N3000 Acute cystitis without hematuria: Secondary | ICD-10-CM | POA: Diagnosis not present

## 2018-01-04 DIAGNOSIS — M1711 Unilateral primary osteoarthritis, right knee: Secondary | ICD-10-CM | POA: Diagnosis not present

## 2018-01-04 DIAGNOSIS — M25462 Effusion, left knee: Secondary | ICD-10-CM | POA: Diagnosis not present

## 2018-01-13 DIAGNOSIS — E860 Dehydration: Secondary | ICD-10-CM | POA: Diagnosis not present

## 2018-01-13 DIAGNOSIS — N952 Postmenopausal atrophic vaginitis: Secondary | ICD-10-CM | POA: Diagnosis not present

## 2018-01-13 DIAGNOSIS — R300 Dysuria: Secondary | ICD-10-CM | POA: Diagnosis not present

## 2018-01-27 DIAGNOSIS — R12 Heartburn: Secondary | ICD-10-CM | POA: Diagnosis not present

## 2018-02-01 DIAGNOSIS — M1711 Unilateral primary osteoarthritis, right knee: Secondary | ICD-10-CM | POA: Diagnosis not present

## 2018-02-09 DIAGNOSIS — I10 Essential (primary) hypertension: Secondary | ICD-10-CM | POA: Diagnosis not present

## 2018-02-09 DIAGNOSIS — E785 Hyperlipidemia, unspecified: Secondary | ICD-10-CM | POA: Diagnosis not present

## 2018-02-09 DIAGNOSIS — Z7983 Long term (current) use of bisphosphonates: Secondary | ICD-10-CM | POA: Diagnosis not present

## 2018-02-09 DIAGNOSIS — Z7982 Long term (current) use of aspirin: Secondary | ICD-10-CM | POA: Diagnosis not present

## 2018-02-09 DIAGNOSIS — K219 Gastro-esophageal reflux disease without esophagitis: Secondary | ICD-10-CM | POA: Diagnosis not present

## 2018-02-09 DIAGNOSIS — M810 Age-related osteoporosis without current pathological fracture: Secondary | ICD-10-CM | POA: Diagnosis not present

## 2018-02-09 DIAGNOSIS — Z809 Family history of malignant neoplasm, unspecified: Secondary | ICD-10-CM | POA: Diagnosis not present

## 2018-02-09 DIAGNOSIS — G8929 Other chronic pain: Secondary | ICD-10-CM | POA: Diagnosis not present

## 2018-02-09 DIAGNOSIS — N3281 Overactive bladder: Secondary | ICD-10-CM | POA: Diagnosis not present

## 2018-02-09 DIAGNOSIS — Z79891 Long term (current) use of opiate analgesic: Secondary | ICD-10-CM | POA: Diagnosis not present

## 2018-03-09 DIAGNOSIS — N952 Postmenopausal atrophic vaginitis: Secondary | ICD-10-CM | POA: Diagnosis not present

## 2018-03-09 DIAGNOSIS — R300 Dysuria: Secondary | ICD-10-CM | POA: Diagnosis not present

## 2018-03-09 DIAGNOSIS — R351 Nocturia: Secondary | ICD-10-CM | POA: Diagnosis not present

## 2018-03-24 DIAGNOSIS — R0789 Other chest pain: Secondary | ICD-10-CM | POA: Diagnosis not present

## 2018-03-24 DIAGNOSIS — M79674 Pain in right toe(s): Secondary | ICD-10-CM | POA: Diagnosis not present

## 2018-03-24 DIAGNOSIS — J301 Allergic rhinitis due to pollen: Secondary | ICD-10-CM | POA: Diagnosis not present

## 2018-03-30 DIAGNOSIS — H5202 Hypermetropia, left eye: Secondary | ICD-10-CM | POA: Diagnosis not present

## 2018-04-16 DIAGNOSIS — R42 Dizziness and giddiness: Secondary | ICD-10-CM | POA: Diagnosis not present

## 2018-04-16 DIAGNOSIS — Z23 Encounter for immunization: Secondary | ICD-10-CM | POA: Diagnosis not present

## 2018-04-16 DIAGNOSIS — K219 Gastro-esophageal reflux disease without esophagitis: Secondary | ICD-10-CM | POA: Diagnosis not present

## 2018-04-16 DIAGNOSIS — E782 Mixed hyperlipidemia: Secondary | ICD-10-CM | POA: Diagnosis not present

## 2018-04-16 DIAGNOSIS — I10 Essential (primary) hypertension: Secondary | ICD-10-CM | POA: Diagnosis not present

## 2018-04-16 DIAGNOSIS — R7301 Impaired fasting glucose: Secondary | ICD-10-CM | POA: Diagnosis not present

## 2018-04-28 ENCOUNTER — Ambulatory Visit: Payer: Medicare HMO | Admitting: Neurology

## 2018-04-28 ENCOUNTER — Encounter: Payer: Self-pay | Admitting: Neurology

## 2018-04-28 VITALS — BP 130/80 | HR 79 | Ht 61.0 in | Wt 138.5 lb

## 2018-04-28 DIAGNOSIS — R69 Illness, unspecified: Secondary | ICD-10-CM | POA: Diagnosis not present

## 2018-04-28 DIAGNOSIS — I679 Cerebrovascular disease, unspecified: Secondary | ICD-10-CM | POA: Diagnosis not present

## 2018-04-28 DIAGNOSIS — R42 Dizziness and giddiness: Secondary | ICD-10-CM

## 2018-04-28 MED ORDER — DIAZEPAM 2 MG PO TABS: 2.0000 mg | ORAL_TABLET | Freq: Two times a day (BID) | ORAL | 3 refills | Status: DC

## 2018-04-28 NOTE — Patient Instructions (Signed)
We will start diazepam 2 mg twice a day.

## 2018-04-28 NOTE — Progress Notes (Signed)
Reason for visit: Dizziness  Kristy Pope is an 72 y.o. female  History of present illness:  Kristy Pope is a 72 year old right-handed white female with a history of chronic dizziness.  The patient was given a trial on Topamax when last seen, she only took the medication 2 days but felt she could not tolerate it.  She does have a history of gastroesophageal reflux disease and this medication may have exacerbated that.  The patient indicates that the dizziness is generally better in the morning, worse as the day goes on.  She may have some left-sided neck discomfort at times.  She denies any overt headaches or pressure sensations in the head.  She was set up for a carotid Doppler study a second time but never had the study done.  The patient more recently has noted some numbness and tingling in the toes.  She does not have a history of diabetes.  She has had some decrease in appetite, she has had some recent weight loss.  She returns to this office for an evaluation.  Past Medical History:  Diagnosis Date  . Arthritis   . Dizziness 06/16/2017  . GERD (gastroesophageal reflux disease)   . Hyperlipidemia   . Hypertension   . Nocturia   . Numbness    RT LEG  . Osteoporosis   . Sleeping difficulty    TAKES AMBIEN PRN  . Spinal stenosis     Past Surgical History:  Procedure Laterality Date  . ABDOMINAL HYSTERECTOMY    . LUMBAR LAMINECTOMY/DECOMPRESSION MICRODISCECTOMY N/A 02/22/2013   Procedure: LUMBAR LAMINECTOMY/COMPLETE DECOMPRESSION L4 - L5 1 LEVEL;  Surgeon: Jacki Cones, MD;  Location: [facility];  Service: Orthopedics;  Laterality: N/A;  . ROTATOR CUFF REPAIR  2013   LEFT    History reviewed. No pertinent family history.  Social history:  reports that she has never smoked. She has never used smokeless tobacco. She reports that she does not drink alcohol or use drugs.   No Known Allergies  Medications:  Prior to Admission medications   Medication Sig Start Date End Date  Taking? Authorizing Provider  alendronate (FOSAMAX) 70 MG tablet Take 70 mg by mouth every 7 (seven) days. Reported on 09/10/2015   Yes [provider]  amLODipine (NORVASC) 10 MG tablet Take 10 mg by mouth every evening.   Yes [provider]  aspirin 81 MG tablet Take 81 mg by mouth daily.   Yes [provider]  atorvastatin (LIPITOR) 80 MG tablet Take 80 mg by mouth every evening.   Yes [provider]  ezetimibe (ZETIA) 10 MG tablet Take 10 mg by mouth every evening.   Yes [provider]  meclizine (ANTIVERT) 12.5 MG tablet Take 12.5 mg as needed by mouth for dizziness.   Yes [provider]  Multiple Vitamin (MULTIVITAMIN) capsule Take 1 capsule by mouth daily.   Yes [provider]  omeprazole (PRILOSEC) 20 MG capsule Take 20 mg by mouth daily.   Yes [provider]  oxybutynin (DITROPAN) 5 MG tablet Take 5 mg daily by mouth.   Yes [provider]  traMADol (ULTRAM) 50 MG tablet Take by mouth 3 (three) times daily.   Yes [provider]  diazepam (VALIUM) 2 MG tablet Take 1 tablet (2 mg total) by mouth 2 (two) times daily. 04/28/18   York Spaniel, MD    ROS:  Out of a complete 14 system review of symptoms, the patient complains only of  the following symptoms, and all other reviewed systems are negative.  Dizziness  Blood pressure 130/80, pulse 79, height 5\' 1"  (1.549 m), weight 138 lb 8 oz (62.8 kg), SpO2 98 %.  Physical Exam  General: The patient is alert and cooperative at the time of the examination.  Skin: No significant peripheral edema is noted.   Neurologic Exam  Mental status: The patient is alert and oriented x 3 at the time of the examination. The patient has apparent normal recent and remote memory, with an apparently normal attention span and concentration ability.   Cranial nerves: Facial symmetry is present. Speech is normal, no aphasia or dysarthria is noted. Extraocular  movements are full. Visual fields are full.  Motor: The patient has good strength in all 4 extremities.  Sensory examination: Soft touch sensation is symmetric on the face, arms, and legs.  Coordination: The patient has good finger-nose-finger and heel-to-shin bilaterally.  Gait and station: The patient has a normal gait. Tandem gait is slightly unsteady. Romberg is negative. No drift is seen.  Reflexes: Deep tendon reflexes are symmetric.   Assessment/Plan:  1.  Chronic dizziness  The patient will be given a trial on low-dose diazepam taking 2 mg twice daily.  This sometimes can help central dizziness, it is possible the patient may have a form of a tension type headache.  The patient will be set up for carotid Doppler study again.  If the numbness in the toes progresses, we may need to perform nerve conduction studies in the future, possibly further blood work.  Marlan Palau MD 04/28/2018 11:58 AM  [facility] [address] [place], [place] [address]  Phone [phone] Fax [phone]

## 2018-04-29 ENCOUNTER — Telehealth: Payer: Self-pay | Admitting: Neurology

## 2018-04-29 NOTE — Telephone Encounter (Signed)
Pt called stating medication diazepam (VALIUM) 2 MG tablet is too low of a dosing for her, requesting a call to discuss increasing medication

## 2018-04-29 NOTE — Telephone Encounter (Signed)
I called the patient.  The patient probably has not taken more than 1 or 2 of the diazepam tablets, the medication has long-acting metabolites, may take several weeks to get into her system fully, I would recommend that she wait at least 3 weeks before she calls me back.

## 2018-04-30 ENCOUNTER — Ambulatory Visit (HOSPITAL_COMMUNITY)
Admission: RE | Admit: 2018-04-30 | Discharge: 2018-04-30 | Disposition: A | Discharge status: Home / Self Care | Payer: Medicare HMO | Source: Ambulatory Visit | Attending: Neurology | Admitting: Neurology

## 2018-04-30 DIAGNOSIS — I679 Cerebrovascular disease, unspecified: Secondary | ICD-10-CM

## 2018-04-30 DIAGNOSIS — I6523 Occlusion and stenosis of bilateral carotid arteries: Secondary | ICD-10-CM | POA: Diagnosis not present

## 2018-04-30 DIAGNOSIS — R42 Dizziness and giddiness: Secondary | ICD-10-CM

## 2018-04-30 NOTE — Progress Notes (Signed)
Preliminary notes--Bilateral carotid duplex exam completed. Bilateral ICAs 1-39% stenosis. Bilateral vertebral arteries patent with antegrade flow.  Kristy Pope (RDMS RVT) 04/30/18 10:26 AM

## 2018-05-01 DIAGNOSIS — R079 Chest pain, unspecified: Secondary | ICD-10-CM | POA: Diagnosis not present

## 2018-05-01 DIAGNOSIS — R69 Illness, unspecified: Secondary | ICD-10-CM | POA: Diagnosis not present

## 2018-05-01 DIAGNOSIS — I10 Essential (primary) hypertension: Secondary | ICD-10-CM | POA: Diagnosis not present

## 2018-05-01 DIAGNOSIS — R531 Weakness: Secondary | ICD-10-CM | POA: Diagnosis not present

## 2018-05-01 DIAGNOSIS — G4489 Other headache syndrome: Secondary | ICD-10-CM | POA: Diagnosis not present

## 2018-05-01 DIAGNOSIS — R000 Tachycardia, unspecified: Secondary | ICD-10-CM | POA: Diagnosis not present

## 2018-05-01 DIAGNOSIS — R251 Tremor, unspecified: Secondary | ICD-10-CM | POA: Diagnosis not present

## 2018-05-01 DIAGNOSIS — Z7982 Long term (current) use of aspirin: Secondary | ICD-10-CM | POA: Diagnosis not present

## 2018-05-02 ENCOUNTER — Telehealth: Payer: Self-pay | Admitting: Neurology

## 2018-05-02 NOTE — Telephone Encounter (Signed)
I called the patient.  The carotid Doppler study is unremarkable.    Carotid doppler 04/30/18:  Final Interpretation: Right Carotid: Velocities in the right ICA are consistent with a 1-39% stenosis.  Left Carotid: Velocities in the left ICA are consistent with a 1-39% stenosis.  Vertebrals: Bilateral vertebral arteries demonstrate antegrade flow.

## 2018-05-03 DIAGNOSIS — K219 Gastro-esophageal reflux disease without esophagitis: Secondary | ICD-10-CM | POA: Diagnosis not present

## 2018-05-03 DIAGNOSIS — R42 Dizziness and giddiness: Secondary | ICD-10-CM | POA: Diagnosis not present

## 2018-05-04 NOTE — Telephone Encounter (Signed)
Dr. Anne Hahn- FYI I called pt back. She feels dizzy but it has improved some.  I relayed Dr. Anne Hahn' message again from last week. Advised she should give diazepam at least 3 weeks to get into her system fully. She is agreeable to try this. She will call back if she has further questions/concerns. No call back necessary.

## 2018-05-04 NOTE — Telephone Encounter (Signed)
Pt is asking for a call from RN Faith to discuss how diazepam (VALIUM) 2 MG tablet is making her feel.  Please call

## 2018-05-11 NOTE — Telephone Encounter (Signed)
Pt is asking for a call from RN Faith to discuss her diazepam (VALIUM) 2 MG tablet

## 2018-05-11 NOTE — Telephone Encounter (Signed)
Spoke with Mrs. Satterfield. She wanted to know if she has to wait to eat after taking Diazepam, and I have advised she does not.  She sts. she was told by the pharmacist that she couldn't eat for 2 hours after taking Diazepam, and sts. she feels worse when she takes it on an empty stomach./fim

## 2018-05-11 NOTE — Telephone Encounter (Signed)
Spoke with pt. and answered questions/fim

## 2018-05-11 NOTE — Telephone Encounter (Signed)
Pt states she has a couple of questions about her diazepam and would like a call from RN to discuss

## 2018-05-11 NOTE — Telephone Encounter (Signed)
LMTC./fim 

## 2018-05-13 DIAGNOSIS — M1711 Unilateral primary osteoarthritis, right knee: Secondary | ICD-10-CM | POA: Diagnosis not present

## 2018-05-17 NOTE — Telephone Encounter (Addendum)
Pt has called back asking to speak with RN re: where things stand with her  diazepam (VALIUM) 2 MG tablet, she states she has not heard anymore re: continuing on this medication.  Please call on mobile

## 2018-05-17 NOTE — Telephone Encounter (Signed)
Patient calling to discuss if she is to continue taking diazepam (VALIUM) 2 MG tablet.

## 2018-05-17 NOTE — Telephone Encounter (Signed)
LMOM (identified vm) that Dr. Anne Hahn [facility]. She should be able to pick it up from the pharmacy/fim

## 2018-05-17 NOTE — Telephone Encounter (Signed)
Spoke with pt. and clarified she may take Diazepam prn dizziness, as rx'd by Dr. Ignacia Bayley

## 2018-06-07 MED ORDER — DIAZEPAM 2 MG PO TABS: 2.0000 mg | ORAL_TABLET | Freq: Three times a day (TID) | ORAL | 3 refills | Status: DC | PRN

## 2018-06-07 NOTE — Telephone Encounter (Signed)
Spoke with pt.  She sts. Diazepam still helps dizziness, but is wearing off quicker. Sts. she takes 1 tablet with breakfast, then goes to work, and feels dizzy again by 1130-1200, but is not able to take another Diazepam until 1530-1600. Advised I will give this information to Dr. Anne Hahn and one of [place] will call her back today or tomorrow. She verbalized understanding of same/fim

## 2018-06-07 NOTE — Telephone Encounter (Signed)
Pt calling to inform that this medication is not having the impact it once had.  Pt states it no longer worker for her, please call.  This is re: her diazepam (VALIUM) 2 MG tablet

## 2018-06-07 NOTE — Telephone Encounter (Signed)
LMTC./fim 

## 2018-06-07 NOTE — Telephone Encounter (Signed)
I called the patient.  The patient was getting benefit with the diazepam taking 2 mg twice daily, the medication seems to help her in the morning but by early afternoon she starts getting dizzy again, we will increase the dosing taking 2 mg 3 times daily.

## 2018-06-07 NOTE — Telephone Encounter (Signed)
Pt has returned call to RN Faith, pt is at home now and is asking for a call at [phone]

## 2018-06-07 NOTE — Addendum Note (Signed)
Addended by: York Spaniel on: 06/07/2018 04:57 PM   Modules accepted: Orders

## 2018-06-09 DIAGNOSIS — R300 Dysuria: Secondary | ICD-10-CM | POA: Diagnosis not present

## 2018-06-09 DIAGNOSIS — R351 Nocturia: Secondary | ICD-10-CM | POA: Diagnosis not present

## 2018-06-10 DIAGNOSIS — M25561 Pain in right knee: Secondary | ICD-10-CM | POA: Diagnosis not present

## 2018-06-10 DIAGNOSIS — M1711 Unilateral primary osteoarthritis, right knee: Secondary | ICD-10-CM | POA: Diagnosis not present

## 2018-06-15 ENCOUNTER — Telehealth: Payer: Self-pay | Admitting: Neurology

## 2018-06-15 MED ORDER — DIAZEPAM 2 MG PO TABS: 2.0000 mg | ORAL_TABLET | Freq: Every day | ORAL | 1 refills | Status: AC

## 2018-06-15 NOTE — Telephone Encounter (Signed)
Patient calling to discuss diazepam (VALIUM) 2 MG tablet. She says when she takes medication it makes her feel bad. Is there another medication she can take? [facility] [address] [place]. Please call and advise.

## 2018-06-15 NOTE — Telephone Encounter (Signed)
Spoke with Mrs. Kristy Pope. She sts. Diazepam makes her feel weak, tired. She denies signs of infection. Will check with Dr. Anne HahnWillis for options of decreasing or stopping med/fim

## 2018-06-15 NOTE — Addendum Note (Signed)
Addended by: York SpanielWILLIS, CHARLES K on: 06/15/2018 05:25 PM   Modules accepted: Orders

## 2018-06-15 NOTE — Telephone Encounter (Signed)
I called the patient.  The patient has been feeling bad taking diazepam 1 twice a day, she may convert the medication to taking 1 only at nighttime, and see if this helps her some.  It is hard to tell with talking with the patient whether this is helping her dizziness or not.  She seems to have good days and bad days with the dizziness.

## 2018-06-16 ENCOUNTER — Telehealth: Payer: Self-pay | Admitting: Neurology

## 2018-06-16 NOTE — Telephone Encounter (Signed)
Pt is asking RN to call her, she has questions [id number]:Diazepam

## 2018-06-16 NOTE — Telephone Encounter (Signed)
Spoke with Kristy Pope.  She sts. that after speaking with Dr. Anne HahnWillis yesterday, she is only taking Diazepam once a day, in the evening.  She wants to know what time in the evening she should take Diazepam, and I have advised she take it 30 min. before bedtime, when she is settled in for the evening.  She verbalized understanding of same/fim

## 2018-06-17 NOTE — Telephone Encounter (Signed)
FYI

## 2018-06-17 NOTE — Telephone Encounter (Signed)
Pt is asking for a call from RN Faith re: her Diazepam

## 2018-06-17 NOTE — Telephone Encounter (Signed)
Noted, the patient I assume will contact her office if this does not work out.

## 2018-06-17 NOTE — Telephone Encounter (Signed)
Spoke with Mrs. Kristy Pope. She sts. she took Diazepam at 5:30pm yesterday, felt good this morning, then around 1pm started feeling worse, some more dizzy, sts. she thinks the Diazepam is making her feel worse. I have explained that the dose she took at 5:30 yesterday is out of her system now.  It would not be why she is feeling bad.  I have explained that if she is having more dizziness, the Diazepam is what treats this.  She verbalized understanding of same, sts. she would just like to take Diazepam during the day if she has dizziness.  I have explained this is fine; she just needs to make sure she is somewhere she can sit down, rest in case she has drowsiness associated with Diazepam.  Pt. verbalized understanding of same, but sts. Diazepam does not make her sleepy./fim

## 2018-06-30 ENCOUNTER — Telehealth: Payer: Self-pay | Admitting: Neurology

## 2018-06-30 NOTE — Telephone Encounter (Signed)
Pt requesting a call stating she is almost out of medication diazepam (VALIUM) 2 MG tablet needing refills sent to [facility], please advise.

## 2018-06-30 NOTE — Telephone Encounter (Signed)
[place] [facility] and i let pt know that they had prescription on hold and will get ready for her. She will pick up in am

## 2018-07-12 ENCOUNTER — Telehealth: Payer: Self-pay | Admitting: Neurology

## 2018-07-12 DIAGNOSIS — R42 Dizziness and giddiness: Secondary | ICD-10-CM | POA: Diagnosis not present

## 2018-07-12 DIAGNOSIS — R69 Illness, unspecified: Secondary | ICD-10-CM | POA: Diagnosis not present

## 2018-07-12 DIAGNOSIS — M1711 Unilateral primary osteoarthritis, right knee: Secondary | ICD-10-CM | POA: Diagnosis not present

## 2018-07-12 DIAGNOSIS — R51 Headache: Secondary | ICD-10-CM | POA: Diagnosis not present

## 2018-07-12 NOTE — Telephone Encounter (Signed)
I called the patient.  The patient apparently is using medication from an older prescription, she had been taking diazepam 2 mg 3 times daily, the most recent prescription was to take 1 at night only.  She will try to cut back to this dosing.

## 2018-07-12 NOTE — Telephone Encounter (Signed)
Patient states she is having problems with diazepam (VALIUM) 2 MG tablet. This past Saturday she almost passed out, called ambulance but did not go to the hospital. She would like to discuss medication because it makes her dizzy and weak.

## 2018-07-19 DIAGNOSIS — E782 Mixed hyperlipidemia: Secondary | ICD-10-CM | POA: Diagnosis not present

## 2018-07-19 DIAGNOSIS — K219 Gastro-esophageal reflux disease without esophagitis: Secondary | ICD-10-CM | POA: Diagnosis not present

## 2018-07-19 DIAGNOSIS — I10 Essential (primary) hypertension: Secondary | ICD-10-CM | POA: Diagnosis not present

## 2018-07-19 DIAGNOSIS — N3941 Urge incontinence: Secondary | ICD-10-CM | POA: Diagnosis not present

## 2018-07-19 DIAGNOSIS — E1169 Type 2 diabetes mellitus with other specified complication: Secondary | ICD-10-CM | POA: Diagnosis not present

## 2018-07-19 DIAGNOSIS — R42 Dizziness and giddiness: Secondary | ICD-10-CM | POA: Diagnosis not present

## 2018-07-19 DIAGNOSIS — R7301 Impaired fasting glucose: Secondary | ICD-10-CM | POA: Diagnosis not present

## 2018-07-26 DIAGNOSIS — R42 Dizziness and giddiness: Secondary | ICD-10-CM | POA: Diagnosis not present

## 2018-07-26 DIAGNOSIS — R351 Nocturia: Secondary | ICD-10-CM | POA: Diagnosis not present

## 2018-08-02 DIAGNOSIS — R51 Headache: Secondary | ICD-10-CM | POA: Diagnosis not present

## 2018-08-02 DIAGNOSIS — R0789 Other chest pain: Secondary | ICD-10-CM | POA: Diagnosis not present

## 2018-08-02 DIAGNOSIS — R42 Dizziness and giddiness: Secondary | ICD-10-CM | POA: Diagnosis not present

## 2018-08-02 DIAGNOSIS — R69 Illness, unspecified: Secondary | ICD-10-CM | POA: Diagnosis not present

## 2018-08-06 DIAGNOSIS — M1712 Unilateral primary osteoarthritis, left knee: Secondary | ICD-10-CM | POA: Diagnosis not present

## 2018-08-12 DIAGNOSIS — K219 Gastro-esophageal reflux disease without esophagitis: Secondary | ICD-10-CM | POA: Diagnosis not present

## 2018-08-25 DIAGNOSIS — R69 Illness, unspecified: Secondary | ICD-10-CM | POA: Diagnosis not present

## 2018-08-25 DIAGNOSIS — I10 Essential (primary) hypertension: Secondary | ICD-10-CM | POA: Diagnosis not present

## 2018-08-25 DIAGNOSIS — R012 Other cardiac sounds: Secondary | ICD-10-CM | POA: Diagnosis not present

## 2018-08-25 DIAGNOSIS — H9202 Otalgia, left ear: Secondary | ICD-10-CM | POA: Diagnosis not present

## 2018-08-25 DIAGNOSIS — R42 Dizziness and giddiness: Secondary | ICD-10-CM | POA: Diagnosis not present

## 2018-09-14 DIAGNOSIS — L304 Erythema intertrigo: Secondary | ICD-10-CM | POA: Diagnosis not present

## 2018-09-16 DIAGNOSIS — R69 Illness, unspecified: Secondary | ICD-10-CM | POA: Diagnosis not present

## 2018-09-22 DIAGNOSIS — R51 Headache: Secondary | ICD-10-CM | POA: Diagnosis not present

## 2018-09-22 DIAGNOSIS — R69 Illness, unspecified: Secondary | ICD-10-CM | POA: Diagnosis not present

## 2018-09-22 DIAGNOSIS — R012 Other cardiac sounds: Secondary | ICD-10-CM | POA: Diagnosis not present

## 2018-10-01 DIAGNOSIS — R42 Dizziness and giddiness: Secondary | ICD-10-CM | POA: Diagnosis not present

## 2018-10-01 DIAGNOSIS — R51 Headache: Secondary | ICD-10-CM | POA: Diagnosis not present

## 2018-10-12 DIAGNOSIS — G43B0 Ophthalmoplegic migraine, not intractable: Secondary | ICD-10-CM | POA: Diagnosis not present

## 2018-10-14 DIAGNOSIS — N952 Postmenopausal atrophic vaginitis: Secondary | ICD-10-CM | POA: Diagnosis not present

## 2018-10-14 DIAGNOSIS — R351 Nocturia: Secondary | ICD-10-CM | POA: Diagnosis not present

## 2018-10-19 DIAGNOSIS — E782 Mixed hyperlipidemia: Secondary | ICD-10-CM | POA: Diagnosis not present

## 2018-10-19 DIAGNOSIS — N3941 Urge incontinence: Secondary | ICD-10-CM | POA: Diagnosis not present

## 2018-10-19 DIAGNOSIS — I10 Essential (primary) hypertension: Secondary | ICD-10-CM | POA: Diagnosis not present

## 2018-10-19 DIAGNOSIS — K219 Gastro-esophageal reflux disease without esophagitis: Secondary | ICD-10-CM | POA: Diagnosis not present

## 2018-10-19 DIAGNOSIS — R7301 Impaired fasting glucose: Secondary | ICD-10-CM | POA: Diagnosis not present

## 2018-10-19 DIAGNOSIS — R42 Dizziness and giddiness: Secondary | ICD-10-CM | POA: Diagnosis not present

## 2018-10-25 DIAGNOSIS — M1711 Unilateral primary osteoarthritis, right knee: Secondary | ICD-10-CM | POA: Diagnosis not present

## 2018-11-12 ENCOUNTER — Ambulatory Visit: Payer: Medicare HMO | Admitting: Neurology

## 2018-11-12 DIAGNOSIS — N3000 Acute cystitis without hematuria: Secondary | ICD-10-CM | POA: Diagnosis not present

## 2018-11-12 DIAGNOSIS — R42 Dizziness and giddiness: Secondary | ICD-10-CM | POA: Diagnosis not present

## 2018-11-12 DIAGNOSIS — R51 Headache: Secondary | ICD-10-CM | POA: Diagnosis not present

## 2018-11-12 DIAGNOSIS — Z79899 Other long term (current) drug therapy: Secondary | ICD-10-CM | POA: Diagnosis not present

## 2018-11-12 DIAGNOSIS — I10 Essential (primary) hypertension: Secondary | ICD-10-CM | POA: Diagnosis not present

## 2018-11-12 DIAGNOSIS — H81399 Other peripheral vertigo, unspecified ear: Secondary | ICD-10-CM | POA: Diagnosis not present

## 2018-11-18 DIAGNOSIS — H8113 Benign paroxysmal vertigo, bilateral: Secondary | ICD-10-CM | POA: Diagnosis not present

## 2018-11-18 DIAGNOSIS — Z1159 Encounter for screening for other viral diseases: Secondary | ICD-10-CM | POA: Diagnosis not present

## 2018-11-18 DIAGNOSIS — R42 Dizziness and giddiness: Secondary | ICD-10-CM | POA: Diagnosis not present

## 2018-11-22 ENCOUNTER — Ambulatory Visit: Payer: Medicare HMO | Admitting: Neurology

## 2018-12-21 DIAGNOSIS — R51 Headache: Secondary | ICD-10-CM | POA: Diagnosis not present

## 2019-01-04 DIAGNOSIS — H65192 Other acute nonsuppurative otitis media, left ear: Secondary | ICD-10-CM | POA: Diagnosis not present

## 2019-01-04 DIAGNOSIS — R5383 Other fatigue: Secondary | ICD-10-CM | POA: Diagnosis not present

## 2019-01-07 DIAGNOSIS — Z79899 Other long term (current) drug therapy: Secondary | ICD-10-CM | POA: Diagnosis not present

## 2019-01-07 DIAGNOSIS — R42 Dizziness and giddiness: Secondary | ICD-10-CM | POA: Diagnosis not present

## 2019-01-07 DIAGNOSIS — Z7982 Long term (current) use of aspirin: Secondary | ICD-10-CM | POA: Diagnosis not present

## 2019-01-07 DIAGNOSIS — I10 Essential (primary) hypertension: Secondary | ICD-10-CM | POA: Diagnosis not present

## 2019-01-07 DIAGNOSIS — K219 Gastro-esophageal reflux disease without esophagitis: Secondary | ICD-10-CM | POA: Diagnosis not present

## 2019-01-07 DIAGNOSIS — E7800 Pure hypercholesterolemia, unspecified: Secondary | ICD-10-CM | POA: Diagnosis not present

## 2019-01-07 DIAGNOSIS — I6381 Other cerebral infarction due to occlusion or stenosis of small artery: Secondary | ICD-10-CM | POA: Diagnosis not present

## 2019-01-07 DIAGNOSIS — M1990 Unspecified osteoarthritis, unspecified site: Secondary | ICD-10-CM | POA: Diagnosis not present

## 2019-01-12 DIAGNOSIS — R42 Dizziness and giddiness: Secondary | ICD-10-CM | POA: Diagnosis not present

## 2019-01-20 DIAGNOSIS — R42 Dizziness and giddiness: Secondary | ICD-10-CM | POA: Diagnosis not present

## 2019-02-01 DIAGNOSIS — I10 Essential (primary) hypertension: Secondary | ICD-10-CM | POA: Diagnosis not present

## 2019-02-01 DIAGNOSIS — G459 Transient cerebral ischemic attack, unspecified: Secondary | ICD-10-CM | POA: Diagnosis not present

## 2019-02-01 DIAGNOSIS — E785 Hyperlipidemia, unspecified: Secondary | ICD-10-CM | POA: Diagnosis not present

## 2019-02-01 DIAGNOSIS — Z79899 Other long term (current) drug therapy: Secondary | ICD-10-CM | POA: Diagnosis not present

## 2019-02-01 DIAGNOSIS — R531 Weakness: Secondary | ICD-10-CM | POA: Diagnosis not present

## 2019-02-01 DIAGNOSIS — M1990 Unspecified osteoarthritis, unspecified site: Secondary | ICD-10-CM | POA: Diagnosis not present

## 2019-02-01 DIAGNOSIS — J9690 Respiratory failure, unspecified, unspecified whether with hypoxia or hypercapnia: Secondary | ICD-10-CM | POA: Diagnosis not present

## 2019-02-01 DIAGNOSIS — Z23 Encounter for immunization: Secondary | ICD-10-CM | POA: Diagnosis not present

## 2019-02-02 DIAGNOSIS — G459 Transient cerebral ischemic attack, unspecified: Secondary | ICD-10-CM | POA: Diagnosis not present

## 2019-02-02 DIAGNOSIS — I10 Essential (primary) hypertension: Secondary | ICD-10-CM | POA: Diagnosis not present

## 2019-02-02 DIAGNOSIS — I6523 Occlusion and stenosis of bilateral carotid arteries: Secondary | ICD-10-CM | POA: Diagnosis not present

## 2019-02-02 DIAGNOSIS — E785 Hyperlipidemia, unspecified: Secondary | ICD-10-CM | POA: Diagnosis not present

## 2019-02-02 DIAGNOSIS — R531 Weakness: Secondary | ICD-10-CM | POA: Diagnosis not present

## 2019-02-02 DIAGNOSIS — I340 Nonrheumatic mitral (valve) insufficiency: Secondary | ICD-10-CM | POA: Diagnosis not present

## 2019-02-02 DIAGNOSIS — I361 Nonrheumatic tricuspid (valve) insufficiency: Secondary | ICD-10-CM | POA: Diagnosis not present

## 2019-02-10 DIAGNOSIS — Z8673 Personal history of transient ischemic attack (TIA), and cerebral infarction without residual deficits: Secondary | ICD-10-CM | POA: Diagnosis not present

## 2019-02-14 DIAGNOSIS — N952 Postmenopausal atrophic vaginitis: Secondary | ICD-10-CM | POA: Diagnosis not present

## 2019-02-14 DIAGNOSIS — R300 Dysuria: Secondary | ICD-10-CM | POA: Diagnosis not present

## 2019-02-16 DIAGNOSIS — R7301 Impaired fasting glucose: Secondary | ICD-10-CM | POA: Diagnosis not present

## 2019-02-16 DIAGNOSIS — R42 Dizziness and giddiness: Secondary | ICD-10-CM | POA: Diagnosis not present

## 2019-02-16 DIAGNOSIS — K219 Gastro-esophageal reflux disease without esophagitis: Secondary | ICD-10-CM | POA: Diagnosis not present

## 2019-02-16 DIAGNOSIS — E782 Mixed hyperlipidemia: Secondary | ICD-10-CM | POA: Diagnosis not present

## 2019-02-16 DIAGNOSIS — I10 Essential (primary) hypertension: Secondary | ICD-10-CM | POA: Diagnosis not present

## 2019-02-19 DIAGNOSIS — R1013 Epigastric pain: Secondary | ICD-10-CM | POA: Diagnosis not present

## 2019-02-19 DIAGNOSIS — K219 Gastro-esophageal reflux disease without esophagitis: Secondary | ICD-10-CM | POA: Diagnosis not present

## 2019-02-19 DIAGNOSIS — R918 Other nonspecific abnormal finding of lung field: Secondary | ICD-10-CM | POA: Diagnosis not present

## 2019-02-20 DIAGNOSIS — R918 Other nonspecific abnormal finding of lung field: Secondary | ICD-10-CM | POA: Diagnosis not present

## 2019-02-21 DIAGNOSIS — K219 Gastro-esophageal reflux disease without esophagitis: Secondary | ICD-10-CM | POA: Diagnosis not present

## 2019-02-21 DIAGNOSIS — R002 Palpitations: Secondary | ICD-10-CM | POA: Diagnosis not present

## 2019-02-21 DIAGNOSIS — R110 Nausea: Secondary | ICD-10-CM | POA: Diagnosis not present

## 2019-02-23 DIAGNOSIS — G44039 Episodic paroxysmal hemicrania, not intractable: Secondary | ICD-10-CM | POA: Diagnosis not present

## 2019-03-01 DIAGNOSIS — R51 Headache: Secondary | ICD-10-CM | POA: Diagnosis not present

## 2019-03-03 DIAGNOSIS — M25561 Pain in right knee: Secondary | ICD-10-CM | POA: Diagnosis not present

## 2019-03-05 DIAGNOSIS — R42 Dizziness and giddiness: Secondary | ICD-10-CM | POA: Diagnosis not present

## 2019-03-05 DIAGNOSIS — R531 Weakness: Secondary | ICD-10-CM | POA: Diagnosis not present

## 2019-03-05 DIAGNOSIS — R51 Headache: Secondary | ICD-10-CM | POA: Diagnosis not present

## 2019-03-05 DIAGNOSIS — R5381 Other malaise: Secondary | ICD-10-CM | POA: Diagnosis not present

## 2019-03-07 DIAGNOSIS — R51 Headache: Secondary | ICD-10-CM | POA: Diagnosis not present

## 2019-03-07 DIAGNOSIS — Z9114 Patient's other noncompliance with medication regimen: Secondary | ICD-10-CM | POA: Diagnosis not present

## 2019-03-15 DIAGNOSIS — L304 Erythema intertrigo: Secondary | ICD-10-CM | POA: Diagnosis not present

## 2019-03-16 DIAGNOSIS — M1711 Unilateral primary osteoarthritis, right knee: Secondary | ICD-10-CM | POA: Diagnosis not present

## 2019-03-16 DIAGNOSIS — Z1159 Encounter for screening for other viral diseases: Secondary | ICD-10-CM | POA: Diagnosis not present

## 2019-03-22 DIAGNOSIS — I10 Essential (primary) hypertension: Secondary | ICD-10-CM | POA: Diagnosis not present

## 2019-03-22 DIAGNOSIS — E7800 Pure hypercholesterolemia, unspecified: Secondary | ICD-10-CM | POA: Diagnosis not present

## 2019-03-22 DIAGNOSIS — K219 Gastro-esophageal reflux disease without esophagitis: Secondary | ICD-10-CM | POA: Diagnosis not present

## 2019-03-22 DIAGNOSIS — R42 Dizziness and giddiness: Secondary | ICD-10-CM | POA: Diagnosis not present

## 2019-03-25 DIAGNOSIS — M25561 Pain in right knee: Secondary | ICD-10-CM | POA: Diagnosis not present

## 2019-03-28 ENCOUNTER — Other Ambulatory Visit: Payer: Self-pay | Admitting: *Deleted

## 2019-03-28 DIAGNOSIS — I10 Essential (primary) hypertension: Secondary | ICD-10-CM

## 2019-03-28 NOTE — Patient Outreach (Signed)
Member assessed for potential [facility] Care Management needs as a benefit of [place] [employer].  Per Patient Kristy Pope, Kristy Pope Pope had 6 ED/hospital encounters since June 2020. According to Patient Kristy Pope, Kristy Pope primarily gone to [facility] and [facility]. It appears she Pope a medical history of HLD, HTN, spinal stenosis, anxiety.   Attempted to reach Kristy Pope at [phone] to discuss [place] Management. However, there was not answer. HIPAA compliant voicemail message left. Also attempted cell phone at [phone] no answer. Unable to leave voicemail message.  Confirmed in Patient Kristy Pope and on Medicare's All Payers List that Kristy Pope Pope [employer] not [employer].   Will make referral to [facility] for complex case management due to frequent ED visits.    Marthenia Rolling, MSN-Ed, RN,BSN [facility] Coordinator [phone] [facility]) [phone]  (Toll free office)

## 2019-03-31 ENCOUNTER — Other Ambulatory Visit: Payer: Self-pay

## 2019-03-31 NOTE — Patient Outreach (Signed)
New referral:  Placed call to patient and reviewed reason for call.. Patient states that she does not see Dr. Tobie Pope anymore. Reports she changed doctors 3 weeks ago. Reports she see Kristy Pope with [facility].  Patient reports that she has not had any falls. Reports she has all her medications and reports that she transportation to MD appointments. Patient reports that she lives alone.  Reports her care giver would be Kristy Pope,  ( nephew)  Reports frequent ED visits due to the wrong medications be prescribed. Patient could not elaborate on the medication or give me a name.  Reviewed and confirmed with patient that she is not see Dr. Tobie Pope.   PLAN: in basket message sent to Kristy Pope to inquire if MD is [facility].  I was informed no.  Patient is not eligible for services due to no [facility] doctor. Will plan to make patient inactive.  Kristy Rand, RN, BSN, CEN [facility] [employer] [phone]

## 2019-04-05 DIAGNOSIS — Z9849 Cataract extraction status, unspecified eye: Secondary | ICD-10-CM | POA: Diagnosis not present

## 2019-04-05 DIAGNOSIS — Z961 Presence of intraocular lens: Secondary | ICD-10-CM | POA: Diagnosis not present

## 2019-04-14 DIAGNOSIS — H938X9 Other specified disorders of ear, unspecified ear: Secondary | ICD-10-CM | POA: Diagnosis not present

## 2019-04-14 DIAGNOSIS — H9209 Otalgia, unspecified ear: Secondary | ICD-10-CM | POA: Diagnosis not present

## 2019-04-14 DIAGNOSIS — R2681 Unsteadiness on feet: Secondary | ICD-10-CM | POA: Diagnosis not present

## 2019-04-14 DIAGNOSIS — H9203 Otalgia, bilateral: Secondary | ICD-10-CM | POA: Diagnosis not present

## 2019-04-14 DIAGNOSIS — Z8669 Personal history of other diseases of the nervous system and sense organs: Secondary | ICD-10-CM | POA: Diagnosis not present

## 2019-04-22 DIAGNOSIS — Z0000 Encounter for general adult medical examination without abnormal findings: Secondary | ICD-10-CM | POA: Diagnosis not present

## 2019-04-22 DIAGNOSIS — Z23 Encounter for immunization: Secondary | ICD-10-CM | POA: Diagnosis not present

## 2019-04-27 DIAGNOSIS — M1711 Unilateral primary osteoarthritis, right knee: Secondary | ICD-10-CM | POA: Diagnosis not present

## 2019-05-01 DIAGNOSIS — S8001XA Contusion of right knee, initial encounter: Secondary | ICD-10-CM | POA: Diagnosis not present

## 2019-06-01 DIAGNOSIS — M79671 Pain in right foot: Secondary | ICD-10-CM | POA: Diagnosis not present

## 2019-06-02 DIAGNOSIS — M21371 Foot drop, right foot: Secondary | ICD-10-CM | POA: Diagnosis not present

## 2019-06-08 DIAGNOSIS — G43709 Chronic migraine without aura, not intractable, without status migrainosus: Secondary | ICD-10-CM | POA: Diagnosis not present

## 2019-06-08 DIAGNOSIS — R519 Headache, unspecified: Secondary | ICD-10-CM | POA: Diagnosis not present

## 2019-06-08 DIAGNOSIS — G43909 Migraine, unspecified, not intractable, without status migrainosus: Secondary | ICD-10-CM | POA: Diagnosis not present

## 2019-06-08 DIAGNOSIS — R42 Dizziness and giddiness: Secondary | ICD-10-CM | POA: Diagnosis not present

## 2019-06-10 DIAGNOSIS — M4316 Spondylolisthesis, lumbar region: Secondary | ICD-10-CM | POA: Diagnosis not present

## 2019-06-23 DIAGNOSIS — R739 Hyperglycemia, unspecified: Secondary | ICD-10-CM | POA: Diagnosis not present

## 2019-06-25 DIAGNOSIS — M5136 Other intervertebral disc degeneration, lumbar region: Secondary | ICD-10-CM | POA: Diagnosis not present

## 2019-07-04 DIAGNOSIS — R519 Headache, unspecified: Secondary | ICD-10-CM | POA: Diagnosis not present

## 2019-07-04 DIAGNOSIS — M48062 Spinal stenosis, lumbar region with neurogenic claudication: Secondary | ICD-10-CM | POA: Diagnosis not present

## 2019-07-04 DIAGNOSIS — G43709 Chronic migraine without aura, not intractable, without status migrainosus: Secondary | ICD-10-CM | POA: Diagnosis not present

## 2019-07-05 DIAGNOSIS — N281 Cyst of kidney, acquired: Secondary | ICD-10-CM | POA: Diagnosis not present

## 2019-07-05 DIAGNOSIS — N952 Postmenopausal atrophic vaginitis: Secondary | ICD-10-CM | POA: Diagnosis not present

## 2019-07-08 DIAGNOSIS — M4316 Spondylolisthesis, lumbar region: Secondary | ICD-10-CM | POA: Diagnosis not present

## 2019-07-11 DIAGNOSIS — Z1231 Encounter for screening mammogram for malignant neoplasm of breast: Secondary | ICD-10-CM | POA: Diagnosis not present

## 2019-08-03 DIAGNOSIS — M1712 Unilateral primary osteoarthritis, left knee: Secondary | ICD-10-CM | POA: Diagnosis not present

## 2019-12-24 DIAGNOSIS — K219 Gastro-esophageal reflux disease without esophagitis: Secondary | ICD-10-CM | POA: Diagnosis not present

## 2019-12-24 DIAGNOSIS — I361 Nonrheumatic tricuspid (valve) insufficiency: Secondary | ICD-10-CM

## 2019-12-24 DIAGNOSIS — G459 Transient cerebral ischemic attack, unspecified: Secondary | ICD-10-CM | POA: Diagnosis not present

## 2019-12-24 DIAGNOSIS — E876 Hypokalemia: Secondary | ICD-10-CM

## 2019-12-24 DIAGNOSIS — E785 Hyperlipidemia, unspecified: Secondary | ICD-10-CM | POA: Diagnosis not present

## 2019-12-24 DIAGNOSIS — I10 Essential (primary) hypertension: Secondary | ICD-10-CM

## 2019-12-25 DIAGNOSIS — E876 Hypokalemia: Secondary | ICD-10-CM | POA: Diagnosis not present

## 2019-12-25 DIAGNOSIS — E785 Hyperlipidemia, unspecified: Secondary | ICD-10-CM | POA: Diagnosis not present

## 2019-12-25 DIAGNOSIS — G459 Transient cerebral ischemic attack, unspecified: Secondary | ICD-10-CM | POA: Diagnosis not present

## 2019-12-25 DIAGNOSIS — K219 Gastro-esophageal reflux disease without esophagitis: Secondary | ICD-10-CM | POA: Diagnosis not present

## 2019-12-26 DIAGNOSIS — E876 Hypokalemia: Secondary | ICD-10-CM | POA: Diagnosis not present

## 2019-12-26 DIAGNOSIS — E785 Hyperlipidemia, unspecified: Secondary | ICD-10-CM | POA: Diagnosis not present

## 2019-12-26 DIAGNOSIS — K219 Gastro-esophageal reflux disease without esophagitis: Secondary | ICD-10-CM | POA: Diagnosis not present

## 2019-12-26 DIAGNOSIS — G459 Transient cerebral ischemic attack, unspecified: Secondary | ICD-10-CM | POA: Diagnosis not present

## 2020-03-23 ENCOUNTER — Other Ambulatory Visit: Payer: Self-pay | Admitting: Family Medicine

## 2020-12-04 DIAGNOSIS — I499 Cardiac arrhythmia, unspecified: Secondary | ICD-10-CM
